# Patient Record
Sex: Female | Born: 1965 | Race: White | Hispanic: No | Marital: Single | Smoking: Current every day smoker
Health system: Southern US, Community
[De-identification: ages and names within clinical notes are randomized; demographics above are authoritative.]

## PROBLEM LIST (undated history)

## (undated) DIAGNOSIS — C6990 Malignant neoplasm of unspecified site of unspecified eye: Secondary | ICD-10-CM

## (undated) DIAGNOSIS — C801 Malignant (primary) neoplasm, unspecified: Secondary | ICD-10-CM

## (undated) DIAGNOSIS — N361 Urethral diverticulum: Secondary | ICD-10-CM

## (undated) DIAGNOSIS — IMO0002 Reserved for concepts with insufficient information to code with codable children: Secondary | ICD-10-CM

## (undated) DIAGNOSIS — F419 Anxiety disorder, unspecified: Secondary | ICD-10-CM

## (undated) DIAGNOSIS — M1990 Unspecified osteoarthritis, unspecified site: Secondary | ICD-10-CM

## (undated) DIAGNOSIS — F319 Bipolar disorder, unspecified: Secondary | ICD-10-CM

## (undated) DIAGNOSIS — G8929 Other chronic pain: Secondary | ICD-10-CM

## (undated) DIAGNOSIS — J449 Chronic obstructive pulmonary disease, unspecified: Secondary | ICD-10-CM

## (undated) DIAGNOSIS — N952 Postmenopausal atrophic vaginitis: Secondary | ICD-10-CM

## (undated) DIAGNOSIS — C539 Malignant neoplasm of cervix uteri, unspecified: Secondary | ICD-10-CM

## (undated) DIAGNOSIS — R569 Unspecified convulsions: Secondary | ICD-10-CM

## (undated) DIAGNOSIS — J439 Emphysema, unspecified: Secondary | ICD-10-CM

## (undated) HISTORY — DX: Postmenopausal atrophic vaginitis: N95.2

## (undated) HISTORY — DX: Unspecified convulsions: R56.9

## (undated) HISTORY — PX: ABDOMINAL HYSTERECTOMY: SHX81

## (undated) HISTORY — PX: TOTAL ABDOMINAL HYSTERECTOMY W/ BILATERAL SALPINGOOPHORECTOMY: SHX83

## (undated) HISTORY — DX: Malignant neoplasm of unspecified site of unspecified eye: C69.90

## (undated) HISTORY — DX: Urethral diverticulum: N36.1

## (undated) HISTORY — DX: Reserved for concepts with insufficient information to code with codable children: IMO0002

## (undated) HISTORY — DX: Bipolar disorder, unspecified: F31.9

## (undated) HISTORY — DX: Malignant neoplasm of cervix uteri, unspecified: C53.9

## (undated) HISTORY — DX: Unspecified osteoarthritis, unspecified site: M19.90

## (undated) HISTORY — DX: Malignant (primary) neoplasm, unspecified: C80.1

---

## 1997-06-27 ENCOUNTER — Other Ambulatory Visit: Admission: RE | Admit: 1997-06-27 | Discharge: 1997-06-27 | Payer: Self-pay | Admitting: Obstetrics and Gynecology

## 1997-07-19 ENCOUNTER — Other Ambulatory Visit: Admission: RE | Admit: 1997-07-19 | Discharge: 1997-07-19 | Payer: Self-pay | Admitting: Obstetrics and Gynecology

## 1997-08-04 ENCOUNTER — Emergency Department (HOSPITAL_COMMUNITY): Admission: EM | Admit: 1997-08-04 | Discharge: 1997-08-04 | Payer: Self-pay | Admitting: Emergency Medicine

## 1999-01-22 ENCOUNTER — Emergency Department (HOSPITAL_COMMUNITY): Admission: EM | Admit: 1999-01-22 | Discharge: 1999-01-22 | Payer: Self-pay | Admitting: Emergency Medicine

## 1999-01-22 ENCOUNTER — Encounter: Payer: Self-pay | Admitting: Emergency Medicine

## 1999-01-24 ENCOUNTER — Encounter: Admission: RE | Admit: 1999-01-24 | Discharge: 1999-01-24 | Payer: Self-pay | Admitting: Sports Medicine

## 1999-01-24 ENCOUNTER — Encounter: Payer: Self-pay | Admitting: Sports Medicine

## 1999-01-30 ENCOUNTER — Encounter: Payer: Self-pay | Admitting: *Deleted

## 1999-01-30 ENCOUNTER — Emergency Department (HOSPITAL_COMMUNITY): Admission: EM | Admit: 1999-01-30 | Discharge: 1999-01-30 | Payer: Self-pay | Admitting: Emergency Medicine

## 2001-02-03 ENCOUNTER — Emergency Department (HOSPITAL_COMMUNITY): Admission: EM | Admit: 2001-02-03 | Discharge: 2001-02-03 | Payer: Self-pay | Admitting: *Deleted

## 2001-05-15 ENCOUNTER — Emergency Department (HOSPITAL_COMMUNITY): Admission: EM | Admit: 2001-05-15 | Discharge: 2001-05-15 | Payer: Self-pay | Admitting: Emergency Medicine

## 2008-10-09 ENCOUNTER — Emergency Department: Payer: Self-pay | Admitting: Emergency Medicine

## 2008-10-26 ENCOUNTER — Emergency Department: Payer: Self-pay | Admitting: Emergency Medicine

## 2009-03-07 ENCOUNTER — Emergency Department: Payer: Self-pay | Admitting: Emergency Medicine

## 2009-03-22 ENCOUNTER — Emergency Department: Payer: Self-pay | Admitting: Emergency Medicine

## 2009-03-24 ENCOUNTER — Emergency Department: Payer: Self-pay | Admitting: Emergency Medicine

## 2009-06-28 ENCOUNTER — Ambulatory Visit: Payer: Self-pay

## 2009-09-12 ENCOUNTER — Emergency Department: Payer: Self-pay | Admitting: Emergency Medicine

## 2010-03-04 HISTORY — PX: VAGINAL PROLAPSE REPAIR: SHX830

## 2010-03-20 ENCOUNTER — Emergency Department (HOSPITAL_COMMUNITY)
Admission: EM | Admit: 2010-03-20 | Discharge: 2010-03-20 | Payer: Self-pay | Source: Home / Self Care | Admitting: Emergency Medicine

## 2010-05-28 ENCOUNTER — Emergency Department (HOSPITAL_COMMUNITY)
Admission: EM | Admit: 2010-05-28 | Discharge: 2010-05-28 | Discharge status: Against Medical Advice | Payer: Self-pay | Attending: Emergency Medicine | Admitting: Emergency Medicine

## 2010-05-28 ENCOUNTER — Emergency Department (HOSPITAL_COMMUNITY)
Admission: EM | Admit: 2010-05-28 | Discharge: 2010-05-28 | Disposition: A | Discharge status: Home / Self Care | Payer: Self-pay | Attending: Emergency Medicine | Admitting: Emergency Medicine

## 2010-05-28 ENCOUNTER — Emergency Department (HOSPITAL_COMMUNITY): Payer: Self-pay

## 2010-05-28 DIAGNOSIS — F17200 Nicotine dependence, unspecified, uncomplicated: Secondary | ICD-10-CM | POA: Insufficient documentation

## 2010-05-28 DIAGNOSIS — R5383 Other fatigue: Secondary | ICD-10-CM | POA: Insufficient documentation

## 2010-05-28 DIAGNOSIS — R0602 Shortness of breath: Secondary | ICD-10-CM | POA: Insufficient documentation

## 2010-05-28 DIAGNOSIS — R5381 Other malaise: Secondary | ICD-10-CM | POA: Insufficient documentation

## 2010-05-28 DIAGNOSIS — R21 Rash and other nonspecific skin eruption: Secondary | ICD-10-CM | POA: Insufficient documentation

## 2010-06-11 ENCOUNTER — Emergency Department (HOSPITAL_COMMUNITY)
Admission: EM | Admit: 2010-06-11 | Discharge: 2010-06-11 | Disposition: A | Discharge status: Home / Self Care | Payer: Self-pay | Attending: Emergency Medicine | Admitting: Emergency Medicine

## 2010-06-11 ENCOUNTER — Emergency Department (HOSPITAL_COMMUNITY): Payer: Self-pay

## 2010-06-11 DIAGNOSIS — R112 Nausea with vomiting, unspecified: Secondary | ICD-10-CM | POA: Insufficient documentation

## 2010-06-11 DIAGNOSIS — N8111 Cystocele, midline: Secondary | ICD-10-CM | POA: Insufficient documentation

## 2010-06-11 DIAGNOSIS — R109 Unspecified abdominal pain: Secondary | ICD-10-CM | POA: Insufficient documentation

## 2010-06-11 DIAGNOSIS — R32 Unspecified urinary incontinence: Secondary | ICD-10-CM | POA: Insufficient documentation

## 2010-06-11 DIAGNOSIS — R079 Chest pain, unspecified: Secondary | ICD-10-CM | POA: Insufficient documentation

## 2010-06-11 LAB — POCT I-STAT, CHEM 8
BUN: 9 mg/dL (ref 6–23)
Chloride: 108 mEq/L (ref 96–112)
Potassium: 3.8 mEq/L (ref 3.5–5.1)
Sodium: 141 mEq/L (ref 135–145)

## 2010-06-11 LAB — URINE MICROSCOPIC-ADD ON

## 2010-06-11 LAB — URINALYSIS, ROUTINE W REFLEX MICROSCOPIC
Glucose, UA: NEGATIVE mg/dL
Ketones, ur: NEGATIVE mg/dL
Leukocytes, UA: NEGATIVE
pH: 6 (ref 5.0–8.0)

## 2010-06-11 LAB — DIFFERENTIAL
Basophils Relative: 0 % (ref 0–1)
Lymphocytes Relative: 34 % (ref 12–46)
Lymphs Abs: 2.5 10*3/uL (ref 0.7–4.0)
Monocytes Relative: 5 % (ref 3–12)
Neutro Abs: 4.4 10*3/uL (ref 1.7–7.7)
Neutrophils Relative %: 60 % (ref 43–77)

## 2010-06-11 LAB — CBC
Hemoglobin: 14.9 g/dL (ref 12.0–15.0)
MCH: 31.8 pg (ref 26.0–34.0)
MCV: 93 fL (ref 78.0–100.0)
RBC: 4.69 MIL/uL (ref 3.87–5.11)

## 2010-06-27 ENCOUNTER — Emergency Department (HOSPITAL_COMMUNITY)
Admission: EM | Admit: 2010-06-27 | Discharge: 2010-06-27 | Disposition: A | Discharge status: Home / Self Care | Payer: Self-pay | Attending: Emergency Medicine | Admitting: Emergency Medicine

## 2010-06-27 DIAGNOSIS — Y929 Unspecified place or not applicable: Secondary | ICD-10-CM | POA: Insufficient documentation

## 2010-06-27 DIAGNOSIS — IMO0002 Reserved for concepts with insufficient information to code with codable children: Secondary | ICD-10-CM | POA: Insufficient documentation

## 2010-06-27 DIAGNOSIS — J45909 Unspecified asthma, uncomplicated: Secondary | ICD-10-CM | POA: Insufficient documentation

## 2010-06-27 DIAGNOSIS — Z8541 Personal history of malignant neoplasm of cervix uteri: Secondary | ICD-10-CM | POA: Insufficient documentation

## 2010-06-27 DIAGNOSIS — R109 Unspecified abdominal pain: Secondary | ICD-10-CM | POA: Insufficient documentation

## 2010-06-27 DIAGNOSIS — I891 Lymphangitis: Secondary | ICD-10-CM | POA: Insufficient documentation

## 2010-06-27 DIAGNOSIS — W57XXXA Bitten or stung by nonvenomous insect and other nonvenomous arthropods, initial encounter: Secondary | ICD-10-CM | POA: Insufficient documentation

## 2010-06-27 DIAGNOSIS — S30860A Insect bite (nonvenomous) of lower back and pelvis, initial encounter: Secondary | ICD-10-CM | POA: Insufficient documentation

## 2010-06-28 ENCOUNTER — Telehealth: Payer: Self-pay | Admitting: Internal Medicine

## 2010-06-28 NOTE — Telephone Encounter (Signed)
Patient's husband states her bladder has fallen. Told him patient would need to see a urologist for this.

## 2010-07-03 ENCOUNTER — Emergency Department (HOSPITAL_COMMUNITY)
Admission: EM | Admit: 2010-07-03 | Discharge: 2010-07-03 | Disposition: A | Discharge status: Home / Self Care | Payer: Self-pay | Attending: Emergency Medicine | Admitting: Emergency Medicine

## 2010-07-03 DIAGNOSIS — R300 Dysuria: Secondary | ICD-10-CM | POA: Insufficient documentation

## 2010-07-03 DIAGNOSIS — G40909 Epilepsy, unspecified, not intractable, without status epilepticus: Secondary | ICD-10-CM | POA: Insufficient documentation

## 2010-07-03 DIAGNOSIS — Z8541 Personal history of malignant neoplasm of cervix uteri: Secondary | ICD-10-CM | POA: Insufficient documentation

## 2010-07-03 DIAGNOSIS — R21 Rash and other nonspecific skin eruption: Secondary | ICD-10-CM | POA: Insufficient documentation

## 2010-07-03 DIAGNOSIS — Z79899 Other long term (current) drug therapy: Secondary | ICD-10-CM | POA: Insufficient documentation

## 2010-07-03 DIAGNOSIS — J45909 Unspecified asthma, uncomplicated: Secondary | ICD-10-CM | POA: Insufficient documentation

## 2010-07-03 LAB — URINALYSIS, ROUTINE W REFLEX MICROSCOPIC
Bilirubin Urine: NEGATIVE
Ketones, ur: NEGATIVE mg/dL
Protein, ur: NEGATIVE mg/dL
Urobilinogen, UA: 0.2 mg/dL (ref 0.0–1.0)

## 2010-07-05 ENCOUNTER — Inpatient Hospital Stay (INDEPENDENT_AMBULATORY_CARE_PROVIDER_SITE_OTHER)
Admission: RE | Admit: 2010-07-05 | Discharge: 2010-07-05 | Disposition: A | Discharge status: Home / Self Care | Payer: Self-pay | Source: Ambulatory Visit | Attending: Family Medicine | Admitting: Family Medicine

## 2010-07-05 DIAGNOSIS — J45909 Unspecified asthma, uncomplicated: Secondary | ICD-10-CM

## 2010-07-25 ENCOUNTER — Inpatient Hospital Stay (HOSPITAL_COMMUNITY)
Admission: AD | Admit: 2010-07-25 | Discharge: 2010-07-25 | Disposition: A | Discharge status: Home / Self Care | Payer: Self-pay | Source: Ambulatory Visit | Attending: Obstetrics and Gynecology | Admitting: Obstetrics and Gynecology

## 2010-07-25 ENCOUNTER — Inpatient Hospital Stay (HOSPITAL_COMMUNITY): Payer: Self-pay

## 2010-07-25 DIAGNOSIS — R3989 Other symptoms and signs involving the genitourinary system: Secondary | ICD-10-CM

## 2010-07-25 DIAGNOSIS — G43909 Migraine, unspecified, not intractable, without status migrainosus: Secondary | ICD-10-CM

## 2010-07-25 LAB — URINE MICROSCOPIC-ADD ON

## 2010-07-25 LAB — URINALYSIS, ROUTINE W REFLEX MICROSCOPIC
Glucose, UA: NEGATIVE mg/dL
Leukocytes, UA: NEGATIVE
Specific Gravity, Urine: >1.03 — ABNORMAL HIGH (ref 1.005–1.030)
pH: 5.5 (ref 5.0–8.0)

## 2010-07-25 LAB — WET PREP, GENITAL: Trich, Wet Prep: NONE SEEN

## 2010-07-25 LAB — CBC
MCHC: 35.2 g/dL (ref 30.0–36.0)
Platelets: 182 10*3/uL (ref 150–400)
RDW: 12.4 % (ref 11.5–15.5)
WBC: 9.2 10*3/uL (ref 4.0–10.5)

## 2010-07-25 LAB — COMPREHENSIVE METABOLIC PANEL
ALT: 10 U/L (ref 0–35)
Albumin: 4.2 g/dL (ref 3.5–5.2)
Alkaline Phosphatase: 104 U/L (ref 39–117)
BUN: 14 mg/dL (ref 6–23)
Calcium: 9.5 mg/dL (ref 8.4–10.5)
Potassium: 4.1 mEq/L (ref 3.5–5.1)
Sodium: 138 mEq/L (ref 135–145)
Total Protein: 7.4 g/dL (ref 6.0–8.3)

## 2010-07-26 LAB — GC/CHLAMYDIA PROBE AMP, GENITAL
Chlamydia, DNA Probe: NEGATIVE
GC Probe Amp, Genital: NEGATIVE

## 2010-08-02 ENCOUNTER — Other Ambulatory Visit: Payer: Self-pay | Admitting: Obstetrics and Gynecology

## 2010-08-02 ENCOUNTER — Encounter (INDEPENDENT_AMBULATORY_CARE_PROVIDER_SITE_OTHER): Payer: Self-pay | Admitting: Obstetrics and Gynecology

## 2010-08-02 DIAGNOSIS — Z124 Encounter for screening for malignant neoplasm of cervix: Secondary | ICD-10-CM

## 2010-08-02 DIAGNOSIS — Z01419 Encounter for gynecological examination (general) (routine) without abnormal findings: Secondary | ICD-10-CM

## 2010-08-02 DIAGNOSIS — R32 Unspecified urinary incontinence: Secondary | ICD-10-CM

## 2010-08-03 NOTE — Group Therapy Note (Signed)
Shelby Wang, Shelby Wang               ACCOUNT NO.:  [id number]  MEDICAL RECORD NO.:  [id number]           PATIENT TYPE:  A  LOCATION:  [facility]                   FACILITY:  [facility]  PHYSICIAN:  Argentina Donovan, MD        DATE OF BIRTH:  Sep 03, 1965  DATE OF SERVICE:  08/02/2010                                 CLINIC NOTE  HISTORY OF PRESENT ILLNESS:  The patient is a 45 year old Caucasian female gravida 3, para 2-0-1-2 who had a total abdominal hysterectomy bilateral salpingo-oophorectomy many years ago for which she states was cervical cancer.  This was done at [facility].  She was on estrogen until about 7 years ago when she said she could not afford and it came off, but 3 months ago, she started taking care of her father who was bedridden with lung cancer.  He would like the garden, so she was helping him garden, started lifting heavy bags and she said at that time she started having upper abdominal pain.  She lost her appetite, she is losing urine.  She is incontinent all the time as well as having urinary frequency.  She is treated for bladder infection in the emergency room last week and she had CMET done which was generally normal.  PHYSICAL EXAMINATION:  NECK:  On examination, her thyroid was symmetrical.  No dominant masses. BREASTS:  The breasts were symmetrical with no masses.  No nipple discharge.  No supraclavicular, axillary nodes. ABDOMEN:  Soft, flat, and nontender.  No masses.  No organomegaly. PELVIC:  The external genitalia is normal.  BUS was quite atrophic with a very tender thickening underneath the urethra given the impression of a urethral diverticulum.  She does not have a significant cystocele. When she Valsalvas she has a very small descensus of the vaginal vault as well as the anterior vaginal wall. RECTAL:  There is no real rectocele noted.  The patient has also been complaining of anorexia, chronic diarrhea for 2 months.  She [facility], and I think we are having to have her see a urologist for urodynamics as well as evaluation of the urethral diverticulum.  A Pap smear was taken from the apex of the vagina because of the history of cervical cancer, although I seen no significant problem there.  I am going to give her an appointment to come back within about a month for followup to make sure she is being seen and taken care, other than that her weight is fine 119 pounds 115/86 blood pressure and a height of 5 feet tall, with a regular pulse of 76 per minute.  I am also placing her on estradiol 1 mg a day and in view of the fact she may end up needing some urological procedure done.  IMPRESSION:  Urethral diverticulum, atrophic vaginitis, upper abdominal pain with diarrhea.          ______________________________ Argentina Donovan, MD    PR/MEDQ  D:  08/02/2010  T:  08/03/2010  Job:  [id number]

## 2010-08-16 ENCOUNTER — Ambulatory Visit: Payer: Self-pay | Admitting: Obstetrics and Gynecology

## 2010-08-16 DIAGNOSIS — N361 Urethral diverticulum: Secondary | ICD-10-CM

## 2010-08-17 NOTE — Group Therapy Note (Signed)
Shelby Wang, Shelby Wang               ACCOUNT NO.:  [id number]  MEDICAL RECORD NO.:  [id number]           PATIENT TYPE:  A  [facility]ATION[facility]                   FACILITY:  [facility]  PHYSICIAN:  Argentina Donovan, MD        DATE OF BIRTH:  06-11-1965  DATE OF SERVICE:  08/16/2010                                 CLINIC NOTE  This is a followup visit on a patient who I saw in the beginning of the month with urethral diverticulum, atrophic vaginitis, upper abdominal pain, and diarrhea, but her main problem is an infected diverticulum. She was referred to a urologist in [facility] who would not treat her because she did not have any insurance, so we are trying get her over to [facility] somebody [place] to see her because she definitely needs a urological consult and surgery, and none of our doctors are qualified to do this kind of surgery.  IMPRESSION:  Urethral diverticulum.          ______________________________ Argentina Donovan, MD    PR/MEDQ  D:  08/16/2010  T:  08/17/2010  Job:  [id number]

## 2010-08-24 ENCOUNTER — Ambulatory Visit: Payer: Self-pay | Admitting: Family Medicine

## 2010-08-27 ENCOUNTER — Other Ambulatory Visit (HOSPITAL_COMMUNITY): Payer: Self-pay | Admitting: Urology

## 2010-08-27 ENCOUNTER — Ambulatory Visit (HOSPITAL_COMMUNITY)
Admission: RE | Admit: 2010-08-27 | Discharge: 2010-08-27 | Disposition: A | Discharge status: Home / Self Care | Payer: Self-pay | Source: Ambulatory Visit | Attending: Urology | Admitting: Urology

## 2010-08-27 DIAGNOSIS — R112 Nausea with vomiting, unspecified: Secondary | ICD-10-CM | POA: Insufficient documentation

## 2010-08-27 DIAGNOSIS — R102 Pelvic and perineal pain: Secondary | ICD-10-CM

## 2010-08-27 DIAGNOSIS — K7689 Other specified diseases of liver: Secondary | ICD-10-CM | POA: Insufficient documentation

## 2010-08-27 DIAGNOSIS — R319 Hematuria, unspecified: Secondary | ICD-10-CM | POA: Insufficient documentation

## 2010-08-27 DIAGNOSIS — N949 Unspecified condition associated with female genital organs and menstrual cycle: Secondary | ICD-10-CM | POA: Insufficient documentation

## 2010-08-27 DIAGNOSIS — R197 Diarrhea, unspecified: Secondary | ICD-10-CM | POA: Insufficient documentation

## 2010-08-27 MED ORDER — IOHEXOL 300 MG/ML  SOLN
100.0000 mL | Freq: Once | INTRAMUSCULAR | Status: AC | PRN
  Administered 2010-08-27: 100 mL via INTRAVENOUS

## 2010-08-30 ENCOUNTER — Inpatient Hospital Stay (HOSPITAL_COMMUNITY)
Admission: AD | Admit: 2010-08-30 | Discharge: 2010-08-30 | Disposition: A | Discharge status: Home / Self Care | Payer: Self-pay | Source: Ambulatory Visit | Attending: Family Medicine | Admitting: Family Medicine

## 2010-08-30 ENCOUNTER — Ambulatory Visit: Payer: Self-pay | Admitting: Obstetrics and Gynecology

## 2010-08-30 DIAGNOSIS — N342 Other urethritis: Secondary | ICD-10-CM

## 2010-08-30 DIAGNOSIS — R319 Hematuria, unspecified: Secondary | ICD-10-CM | POA: Insufficient documentation

## 2010-08-30 LAB — URINALYSIS, ROUTINE W REFLEX MICROSCOPIC
Bilirubin Urine: NEGATIVE
Nitrite: NEGATIVE
Protein, ur: NEGATIVE mg/dL
Specific Gravity, Urine: 1.01 (ref 1.005–1.030)
Urobilinogen, UA: 0.2 mg/dL (ref 0.0–1.0)

## 2010-08-30 LAB — URINE MICROSCOPIC-ADD ON

## 2010-08-30 LAB — WET PREP, GENITAL
Clue Cells Wet Prep HPF POC: NONE SEEN
Trich, Wet Prep: NONE SEEN

## 2010-09-06 ENCOUNTER — Ambulatory Visit: Payer: Self-pay | Admitting: Family Medicine

## 2010-09-07 ENCOUNTER — Ambulatory Visit: Payer: Self-pay | Admitting: Obstetrics and Gynecology

## 2010-09-07 DIAGNOSIS — N361 Urethral diverticulum: Secondary | ICD-10-CM

## 2010-09-08 NOTE — Group Therapy Note (Signed)
Shelby Wang, Shelby Wang               ACCOUNT NO.:  [id number]  MEDICAL RECORD NO.:  [id number]           PATIENT TYPE:  A  [facility]                   [facility]ACILITY:  [facility]  PHYSICIAN:  Argentina Donovan, MD        DATE OF BIRTH:  05-Nov-1965  DATE OF SERVICE:  09/07/2010                                 CLINIC NOTE  The patient is a 45 year old Caucasian female gravida 3, para 2-0-1-2 who has been bothered by a swelling underneath the urethra.  I thought she had an urethral diverticulum, so we referred her to a urologist first when she went to [facility] who would not to see her because she was self-pay and she came back to [place].  We gave her pain medication, told to take sitz baths, and sent her to a local urologist.  When he first saw her, he said it was a diverticulum that had ruptured and put her on antibiotics and pain medication, had her come back.  At this time, it had recurred.  At this time, he thought it was not a diverticulum something else.  He had called in a colleague to look and agreed with him that probably was not a diverticulum.  When she called me about this, I had to go into the maternity admissions office, but there once again she was put on antibiotics and pain medicine and [place] was done, so when she called today for pain, I had her come in so I could reevaluate her.  My feeling is still that this is a urethral diverticulum.  I do not think it is a Skene gland abscess.  I sounded the Skene glands with no pus production.  We will get this patient treated so as an alternative I thought I try and drain this from below. I injected with 6 mL of 1% Xylocaine so that I could massage the area, but I saw no pus production then I tried to open it with sharp dissection and got no return.  I am going to place her on pain medication.  We will come back and see what the surgeon thinks should be done in the operating room.  I am not sure that any of our people are really trained  to handle this.  However, we have almost no alternative as urologist seem who have left her out in the cold.  My preference would amend to put a Foley catheter in and then open up underneath the catheter and let this drain.  I am not going to give her any antibiotic because of the pus production and can rupture through and rather have that happen; I think at this point I am going to put her back on pain medication, have her take sitz baths regularly with Epsom salts, come back and see one of our surgeons next week.  My impression is suburethral abscess, probably a urethral diverticulum without Skene gland abscess.          ______________________________ Argentina Donovan, MD    PR/MEDQ  D:  09/07/2010  T:  09/08/2010  Job:  [id number]

## 2010-09-12 ENCOUNTER — Ambulatory Visit: Payer: Self-pay | Admitting: Obstetrics & Gynecology

## 2010-09-17 ENCOUNTER — Ambulatory Visit: Payer: Self-pay | Admitting: Family Medicine

## 2010-09-19 ENCOUNTER — Telehealth: Payer: Self-pay | Admitting: *Deleted

## 2010-09-19 NOTE — Telephone Encounter (Signed)
Pt called to request a refill on her Percocet from [name]. I spoke with [name] regarding this and he agreed to give her a refill on the Percocet. A written rx was given to the patient for Percocet 5mg  #40 and no refills. Pt to pickup rx at front desk.

## 2010-09-25 ENCOUNTER — Encounter: Payer: Self-pay | Admitting: Family Medicine

## 2010-09-25 ENCOUNTER — Ambulatory Visit (INDEPENDENT_AMBULATORY_CARE_PROVIDER_SITE_OTHER): Payer: Self-pay | Admitting: Family Medicine

## 2010-09-25 DIAGNOSIS — R3989 Other symptoms and signs involving the genitourinary system: Secondary | ICD-10-CM

## 2010-09-25 DIAGNOSIS — R112 Nausea with vomiting, unspecified: Secondary | ICD-10-CM

## 2010-09-25 DIAGNOSIS — R634 Abnormal weight loss: Secondary | ICD-10-CM

## 2010-09-25 DIAGNOSIS — Z8541 Personal history of malignant neoplasm of cervix uteri: Secondary | ICD-10-CM

## 2010-09-25 LAB — POCT URINALYSIS DIPSTICK
Glucose, UA: NEGATIVE
Leukocytes, UA: NEGATIVE
Nitrite, UA: NEGATIVE
Protein, UA: NEGATIVE
Urobilinogen, UA: 0.2

## 2010-09-25 LAB — POCT WET PREP (WET MOUNT)
Clue Cells Wet Prep HPF POC: NEGATIVE
Yeast Wet Prep HPF POC: NEGATIVE

## 2010-09-25 LAB — COMPREHENSIVE METABOLIC PANEL
AST: 12 U/L (ref 0–37)
Alkaline Phosphatase: 70 U/L (ref 39–117)
BUN: 9 mg/dL (ref 6–23)
Creat: 0.7 mg/dL (ref 0.50–1.10)
Glucose, Bld: 75 mg/dL (ref 70–99)
Total Bilirubin: 0.4 mg/dL (ref 0.3–1.2)

## 2010-09-25 LAB — CBC
HCT: 43.9 % (ref 36.0–46.0)
Hemoglobin: 14.7 g/dL (ref 12.0–15.0)
MCH: 31.4 pg (ref 26.0–34.0)
MCHC: 33.5 g/dL (ref 30.0–36.0)
RBC: 4.68 MIL/uL (ref 3.87–5.11)

## 2010-09-25 LAB — POCT UA - MICROSCOPIC ONLY

## 2010-09-25 LAB — POCT GLYCOSYLATED HEMOGLOBIN (HGB A1C): Hemoglobin A1C: 5.5

## 2010-09-25 MED ORDER — OXYCODONE-ACETAMINOPHEN 5-325 MG PO TABS: 1.0000 | ORAL_TABLET | ORAL | Status: AC | PRN

## 2010-09-25 MED ORDER — PROMETHAZINE HCL 12.5 MG PO TABS: 12.5000 mg | ORAL_TABLET | Freq: Four times a day (QID) | ORAL | Status: DC | PRN

## 2010-09-25 NOTE — Patient Instructions (Addendum)
Nice to meet you. I will call if your labs are abnormal. You may try AZO for urinary pain over the counter. Please make appointment in next month to discuss other health issues if desired.

## 2010-09-26 ENCOUNTER — Encounter: Payer: Self-pay | Admitting: Family Medicine

## 2010-09-26 ENCOUNTER — Telehealth: Payer: Self-pay | Admitting: Family Medicine

## 2010-09-26 DIAGNOSIS — Z8541 Personal history of malignant neoplasm of cervix uteri: Secondary | ICD-10-CM | POA: Insufficient documentation

## 2010-09-26 NOTE — Telephone Encounter (Signed)
Called to discuss normal labs. Urine culture pending. Pain is stable. Warned of red flags for MD evaluation: fever, n/v, worsening of pain, bleeding.

## 2010-09-26 NOTE — Assessment & Plan Note (Signed)
Likely multifactoral process with atrophic vaginitis and atypical anatomy post-TAH with question of urethral diverticulum. WIll check UA and urine ctx to rule out cystitis and wet prep, GC/Chl for other infectious sources. Will check WBC to eval for systemic infectious process. Oxycodone for pain due to severity and decline in functional ability. Patient to f/u with OB/GYN and urology referral, appt in mid-august.

## 2010-09-26 NOTE — Assessment & Plan Note (Signed)
Negative pap of vaginal vault at GYN clinic in 07/2010. Negative CT in 08/2010.

## 2010-09-26 NOTE — Telephone Encounter (Signed)
Dr was supposed to call her with results - has not heard yet.

## 2010-09-26 NOTE — Assessment & Plan Note (Signed)
Most likely result of decreased appetite secondary to pain. Recent CT abd/pelvis negative for recurrent malignancy. Will check labs CMET, CBC, A1C to evaluate for nutrition status and glucose metabolism. Encourage patient to eat small meals as tolerated, phenergan prn for nausea.

## 2010-09-26 NOTE — Progress Notes (Signed)
  Subjective:    Patient ID: Shelby Wang, female    DOB: 25-Jul-1965, 45 y.o.   MRN: [id number]  HPI New patient to establish care, no current PCP.   1. Pelvic pain. Has been intermittent over past several months. Evaluated in [facility] and follow up by GYN- last saw Dr. Okey Wang few weeks ago for evaluation of pain who found atrophic vaginitis and likely urethral diverticulum. Was also evaluated by a urologist who did not agree with urethral involvement. Dr. Okey Wang performed I&D of "diverticulum," but no purulence was uncovered. Was not believed to be involved with Skene glands or infectious source. Plans to follow up with Dr. Okey Wang and has referral to different Urologist at [facility] in mid-August. Notably has hx of TAH_BSO for cervical cancer >10 yrs ago.  Patient is unfortunately having severe perineal pain again this week. Also having dysuria and frequency. Pain is positional and has to sit with legs spread to avoid pain.  Mild white discharge. Denies abnormal bleeding or hematuria.   2. Weight loss. Totals 8 lb in past month per patient. Thinks her severe pelvic pain has diminished her appetite and she is nauseas at times. Is keeping down fluids and most food. Denies abdominal pain. Had some loose stools in recent past.    Review of Systems See HPI. Denies fevers, abdominal pain, n/v, SOB. Endorses left inguinal LAD, weight loss of 8 lb this month.     Objective:   Physical Exam  Vitals reviewed. Constitutional: She is oriented to person, place, and time. She appears well-developed and well-nourished.       Appears uncomfortable.  HENT:  Head: Normocephalic and atraumatic.  Eyes: EOM are normal. Pupils are equal, round, and reactive to light.  Cardiovascular: Normal rate and regular rhythm.   No murmur heard. Pulmonary/Chest: Effort normal. No respiratory distress. She has no wheezes.  Abdominal: Soft. Bowel sounds are normal. She exhibits no distension. There is no tenderness. There is no  guarding.  Genitourinary: No vaginal discharge found.       Extremely tender to touch, exam limited due to pain and patient intolerance. Refuses speculum exam. Atrophy in labia minora. No bleeding,  lesions or abcesses palpated. Urethra appears WNL. No discharge noted. Single left inguinal node <0.5cm, nontender.  Musculoskeletal: She exhibits no edema and no tenderness.  Neurological: She is alert and oriented to person, place, and time. No cranial nerve deficit.          Assessment & Plan:

## 2010-09-27 LAB — URINE CULTURE

## 2010-09-28 ENCOUNTER — Telehealth: Payer: Self-pay | Admitting: Family Medicine

## 2010-09-28 NOTE — Telephone Encounter (Signed)
Spoke with  patient and she states her pain is worse than when at office visit earlier this week. States she hurts in lymph node area , groin area.  Paged Dr. Cristal Ford and she advises that if pain is this severe she needs to be re evaluated . Advised daughter to take to either ED or Urgent Care.

## 2010-09-28 NOTE — Telephone Encounter (Signed)
Ms. Tadros daughter has called and said that the pain medication Percocet doesn't seem to be helping.  Ms. Geck was directed to take 1 every 4 hours, but it wasn't helping so the daughter told her to try taking 2 and that lasted 30 minutes.  They would like to know if there is something stronger she can take and would like a prescription for that.

## 2010-10-01 ENCOUNTER — Other Ambulatory Visit: Payer: Self-pay

## 2010-10-01 MED ORDER — FLUCONAZOLE 150 MG PO TABS: 150.0000 mg | ORAL_TABLET | Freq: Once | ORAL | Status: AC

## 2010-10-01 MED ORDER — DICLOFENAC SODIUM 75 MG PO TBEC: 75.0000 mg | DELAYED_RELEASE_TABLET | Freq: Two times a day (BID) | ORAL | Status: DC

## 2010-10-01 MED ORDER — HYDROCODONE-ACETAMINOPHEN 5-500 MG PO TABS: 1.0000 | ORAL_TABLET | Freq: Four times a day (QID) | ORAL | Status: DC | PRN

## 2010-10-01 NOTE — Telephone Encounter (Signed)
Pt returned call and I asked pt when was her appt for the urologist she stated Aug 16th.  I informed pt of new changes to her pain med regimen as stated in above note.  And also explained reason for not prescribing any antibiotics as well as stated in the above note.  Pt stated that she has white clumpy stuff coming from vagina and I informed pt that she had a yeast infection, monistat otc would be good.  But we will give a Rx for the yeast infection as well.  Pt stated that understands and will be here later today to pick up Rx.  Also informed pt that as of Aug 16th the day of urologist appt, that she will need to direct her futher s/s of this issue to her urologist.  Pt stated understanding.

## 2010-10-01 NOTE — Telephone Encounter (Signed)
Pt called and stated that she needed another Rx for antibiotics for the inflammation in lymph nodes in her vaginal area and more pain med.  Per Dr. Macon Large will not be prescribing med for antibiotics due to the fact that if the antibiotics given haven't penetrated yet, then they aren't.  And  Rx has been changed from percocet to Vicodin 5/500 1-2 tabs q 6 hrs along with Diclofenac DR 75mg  bid that way she will not need as much as the Vicodin.

## 2010-10-08 ENCOUNTER — Telehealth: Payer: Self-pay | Admitting: *Deleted

## 2010-10-08 NOTE — Telephone Encounter (Signed)
[facility] to see when Vicodin was filled last. Left message for pharmacist to call back. Have not called pt at this time.

## 2010-10-09 ENCOUNTER — Other Ambulatory Visit: Payer: Self-pay

## 2010-10-09 ENCOUNTER — Telehealth: Payer: Self-pay | Admitting: Family Medicine

## 2010-10-09 NOTE — Telephone Encounter (Signed)
Yes she needs to come back if she needs more pain medicine because I don't have a documented cause for the pain and she needs to be re-evaluated. Thanks

## 2010-10-09 NOTE — Telephone Encounter (Signed)
Spoke with patient and she has used AZO with no help. Wanted to know if you would call something in for her. I informed her that she may have to come into the office. Laureen Ochs, [name]

## 2010-10-09 NOTE — Telephone Encounter (Signed)
Is needing a Rx for pain medicine for when going to the bathroom.  Has blood in urine.  Has an appt with a Urologist on 8/16, but needs something until then.

## 2010-10-09 NOTE — Telephone Encounter (Signed)
Stoney Bang LPN spoke w/pharmacist @ [facility] re: pt recent pain med scripts. Her recent hx as follows:               10/01/10    Vicodin 5/500   # 30   Dr. Macon Large              10/01/10    Diclofenac 75 mg # 30 Dr. Macon Large              09/25/10    Percocet 5 mg # 30  Dr. Cristal Ford              09/20/10    Percocet 5 mg # 40  [name]              09/13/10    Percocet 5 mg # 15  Dr. Clinton Gallant              09/07/10      Percocet 5 mg # 40  [name]              09/04/10      Percocet 10 mg # 30  Dr. Signe Colt              08/31/10    Percocet 5 mg  # 20   Deirdre Poe CNM   Upon review of telephone encounter 09/28/10  w/Patricia Katrinka Blazing RN, pt's daughter was instructed to take Shon to the ED for re-evaluation of pain. Pt has been getting pain med Rx from multiple providers and this was not known by our office until today. When Dr. Macon Large ordered Rx's on 10/01/10, it was her intention that the meds would be sufficient until pt's appt. @ [facility] urology clinic in mid August. I called pt and spoke with a man who stated that he was not @ home but would return in 30 min. And I could call back to speak with Maegen @ that time.

## 2010-10-09 NOTE — Telephone Encounter (Signed)
Spoke w/pt.  I stated that she has received quite a bit of narcotic med over the past month from several different providers and that our physicians could not give any additional refills without a new evaluation. I said that it was Dr. Mont Dutton intention that her recent refill would be adequate until her appt @ [facility]. Pt stated that she could not take the Diclofenac because it upsets her stomach- even when taken with food.  I advised pt to go to [facility] for evaluation if she needs additional meds prior to [facility] clinic appt next week. I also stated that once she is seen @ that clinic , they should take over her pain management for this problem. Pt. voiced understanding.

## 2010-10-09 NOTE — Telephone Encounter (Signed)
Informed pt that Dr. Cristal Ford would like for her to come in to be evaluated. Pt agreed.Shelby Wang, [name]

## 2010-10-10 ENCOUNTER — Encounter: Payer: Self-pay | Admitting: Family

## 2010-10-11 ENCOUNTER — Encounter: Payer: Self-pay | Admitting: Family Medicine

## 2010-10-11 ENCOUNTER — Ambulatory Visit (INDEPENDENT_AMBULATORY_CARE_PROVIDER_SITE_OTHER): Payer: Self-pay | Admitting: Family Medicine

## 2010-10-11 VITALS — BP 119/76 | HR 72 | Temp 98.2°F | Wt 108.0 lb

## 2010-10-11 DIAGNOSIS — R319 Hematuria, unspecified: Secondary | ICD-10-CM

## 2010-10-11 DIAGNOSIS — R3989 Other symptoms and signs involving the genitourinary system: Secondary | ICD-10-CM

## 2010-10-11 DIAGNOSIS — R1032 Left lower quadrant pain: Secondary | ICD-10-CM

## 2010-10-11 LAB — POCT URINALYSIS DIPSTICK
Glucose, UA: NEGATIVE
Nitrite, UA: NEGATIVE
Urobilinogen, UA: 0.2
pH, UA: 5.5

## 2010-10-11 MED ORDER — GABAPENTIN 300 MG PO CAPS: 300.0000 mg | ORAL_CAPSULE | Freq: Every day | ORAL | Status: DC

## 2010-10-11 MED ORDER — OXYCODONE-ACETAMINOPHEN 5-500 MG PO CAPS: 1.0000 | ORAL_CAPSULE | ORAL | Status: AC | PRN

## 2010-10-11 MED ORDER — AMITRIPTYLINE HCL 25 MG PO TABS: 25.0000 mg | ORAL_TABLET | Freq: Every day | ORAL | Status: DC

## 2010-10-11 NOTE — Patient Instructions (Addendum)
Will try to start pain medicine for nerve pain today. Neurontin 300mg  at nighttime.  Get your xrays today. I will call you with results. Go to your urology appointment on 8/15 as scheduled. Follow up in 1-2 weeks. Important to see your psychiatrist asap. Most important to see Rudell Cobb for medical assistance.

## 2010-10-12 ENCOUNTER — Ambulatory Visit (HOSPITAL_COMMUNITY)
Admission: RE | Admit: 2010-10-12 | Discharge: 2010-10-12 | Disposition: A | Discharge status: Home / Self Care | Payer: Self-pay | Source: Ambulatory Visit | Attending: Family Medicine | Admitting: Family Medicine

## 2010-10-12 DIAGNOSIS — M25559 Pain in unspecified hip: Secondary | ICD-10-CM | POA: Insufficient documentation

## 2010-10-12 DIAGNOSIS — R1032 Left lower quadrant pain: Secondary | ICD-10-CM | POA: Insufficient documentation

## 2010-10-13 ENCOUNTER — Encounter: Payer: Self-pay | Admitting: Family Medicine

## 2010-10-13 DIAGNOSIS — R319 Hematuria, unspecified: Secondary | ICD-10-CM | POA: Insufficient documentation

## 2010-10-13 NOTE — Assessment & Plan Note (Addendum)
Persistent severe pain with unknown etiology. Records reviewed again-->2x negative CT Abd/pelvis for stones or other intra-abdominal pathology. Labs from 2 weeks ago negative except for persistent hematuria. Question relation to urethral diverticulum and pelvic pain being worked up by GYN-- urology referral made--appointment scheduled 8/15 per patient. Exam today indicates possible involvement of hip joint/low back/SI joint inflammation. Also possible psychosomatic or neuropathic pain syndrome vs drug-seeking vs ?loin pain/hematuria syndrome.   Will obtain lumbar and hip plain films to be complete and may show any radiopaque renal stones. Most important to make scheduled urology appointment. Will refill percocet #15 and start low dose neurontin to assess for neuropathic response. Advised pt to seek care with her previous psychiatrist as there may be component of mood disorder playing role.

## 2010-10-13 NOTE — Assessment & Plan Note (Signed)
Associated with chronic left inguinal pain. CT stone studies negative. Possibly related to evaluation for urethral diverticulum. Urology referral made to [facility] by [facility] for additional workup.

## 2010-10-13 NOTE — Progress Notes (Signed)
  Subjective:    Patient ID: Shelby Wang, female    DOB: 1965/05/14, 45 y.o.   MRN: [id number]  HPI  1. Inguinal/pelvic pain. This is same pain she's had for months now. Continues to be severe and anxious due to lack of diagnosis and adequate treatment. Has called office, OB multiple times requesting refills for pain medication. Pain is 9/10, located in left inguinal area, characterized as burning/aching, radiates to anterior thigh, perineum and sometimes in low back region/hip. She cannot function at home except to lay in her recliner with her legs propped up. Thinks she feels "node" in her left inguinal region. Has associated decreased appetite, nausea, and continues to lose weight even since last office visit.   Still has Urology appt at [facility] scheduled for 8/15, but couldn't make it because she ran out of pain medication early this am.   2. Psychosocial. Per patient, her husband has Stage IV lung cancer with brain involvement and she has to take care of him daily. This is quite stressful.  Has a history of unspecified bipolar disorder, hasn't seen a psychiatrist in several years and hasn't taken her meds because she can't afford. Has paperwork for Rudell Cobb that she hasn't turned in yet. Plans to do so in near future.   Review of Systems Denies fevers. Endorses some dysuria.     Objective:   Physical Exam  Vitals reviewed. Constitutional: She is oriented to person, place, and time. She appears well-developed and well-nourished. She appears distressed.       Appears in significant distress/pain. Unable to sit still, very tearful.   HENT:  Head: Normocephalic and atraumatic.       Small maculopapular scab type lesion on left lateral nose.  Eyes: EOM are normal. Pupils are equal, round, and reactive to light.  Cardiovascular: Normal rate, regular rhythm, normal heart sounds and intact distal pulses.  Exam reveals no gallop.   No murmur heard. Pulmonary/Chest: Effort normal and  breath sounds normal. No respiratory distress. She has no wheezes. She has no rales.  Abdominal: Soft. Bowel sounds are normal. She exhibits no distension and no mass. There is tenderness. There is guarding.       TTP in LLQ/inguinal region. No masses or nodes palpated.   Musculoskeletal: Normal range of motion. She exhibits no edema and no tenderness.       Pain exacerbated by Left hip ROM. Tender anteriorly over inguinal ligament. TTP over lumbar spinous processes and bilateral SI joints. Positive faber and SL testing. 5/5 bilateral LE strength.   Neurological: She is alert and oriented to person, place, and time. No cranial nerve deficit. She exhibits normal muscle tone.  Psychiatric:       Very tearful and anxious, appeared to improve throughout exam with conversation regarding social stressors/care of her husband.          Assessment & Plan:

## 2010-10-13 NOTE — Assessment & Plan Note (Signed)
Persists but left inguinal pain more prominent today. F/u with urology to evaluate anatomy in work up for possible diverticulum.

## 2010-10-18 ENCOUNTER — Other Ambulatory Visit: Payer: Self-pay | Admitting: *Deleted

## 2010-10-18 NOTE — Telephone Encounter (Signed)
Left message for pt to return our call. 

## 2010-10-18 NOTE — Telephone Encounter (Signed)
Informed pt that we can no longer give her pain meds for her urology problem. Pt agrees and states that she will call the urology office.

## 2010-10-18 NOTE — Telephone Encounter (Signed)
I do not understand what patient means when she reports that "[name] cut her urethra" as [name] does not perform any procedures. Nonetheless, if she is indeed having urethral pain, the urology team should be giving her any further prescriptions.  No further prescriptions for narcotics should be given from this clinic without having a GYN etiology.

## 2010-10-18 NOTE — Telephone Encounter (Signed)
Upon further review of her chart, I did see that [name] performed an in-clinic I&D for urethral diverticulum on the patient on 09/07/10.  Nevertheless, it has been over a month ago and she should not need narcotics for such a small procedure.  Also, after reviewing her PCP notes, she had more of inguinal pain that was more concerning than her urethral pain.  As such, I still maintain that she does not have any GYN etiology or any acute reason to need narcotic medications.  If the urologist feels that she needs narcotics, they can provide her with the appropriate medications.

## 2010-10-18 NOTE — Telephone Encounter (Signed)
Pt called and states that she saw the urologist yesterday and he saw that [name] had cut her urethra. He advised that she do soaks in peroxide and water to help heal. She is calling us because she wants more pain medicine. She states that she is having terrible pain. She recently had Percocet prescribed to her by Dr. Cristal Ford on 8/9 #15, prior to that she was given Vicodin by Dr. Macon Large on 7/30. See previous telephone note to see her recent pain meds prescribed.

## 2010-10-25 ENCOUNTER — Ambulatory Visit: Payer: Self-pay | Admitting: Family Medicine

## 2010-11-12 ENCOUNTER — Telehealth: Payer: Self-pay | Admitting: Family Medicine

## 2010-11-12 DIAGNOSIS — R3989 Other symptoms and signs involving the genitourinary system: Secondary | ICD-10-CM

## 2010-11-12 MED ORDER — GABAPENTIN 300 MG PO CAPS: 300.0000 mg | ORAL_CAPSULE | Freq: Every day | ORAL | Status: DC

## 2010-11-12 NOTE — Telephone Encounter (Signed)
Called to discuss her pain symptoms. Patient requesting refill for neurontin. Ok for refill, would like an office visit when possible.

## 2010-11-19 ENCOUNTER — Ambulatory Visit: Payer: Self-pay | Admitting: Family Medicine

## 2010-12-05 ENCOUNTER — Emergency Department (HOSPITAL_COMMUNITY)
Admission: EM | Admit: 2010-12-05 | Discharge: 2010-12-05 | Discharge status: Against Medical Advice | Payer: Self-pay | Attending: Emergency Medicine | Admitting: Emergency Medicine

## 2010-12-05 ENCOUNTER — Emergency Department (HOSPITAL_COMMUNITY): Payer: Self-pay

## 2010-12-05 ENCOUNTER — Ambulatory Visit: Payer: Self-pay | Admitting: Family Medicine

## 2010-12-05 DIAGNOSIS — R509 Fever, unspecified: Secondary | ICD-10-CM | POA: Insufficient documentation

## 2010-12-05 DIAGNOSIS — G40909 Epilepsy, unspecified, not intractable, without status epilepticus: Secondary | ICD-10-CM | POA: Insufficient documentation

## 2010-12-05 DIAGNOSIS — R05 Cough: Secondary | ICD-10-CM | POA: Insufficient documentation

## 2010-12-05 DIAGNOSIS — R197 Diarrhea, unspecified: Secondary | ICD-10-CM | POA: Insufficient documentation

## 2010-12-05 DIAGNOSIS — R112 Nausea with vomiting, unspecified: Secondary | ICD-10-CM | POA: Insufficient documentation

## 2010-12-05 DIAGNOSIS — F319 Bipolar disorder, unspecified: Secondary | ICD-10-CM | POA: Insufficient documentation

## 2010-12-05 DIAGNOSIS — R059 Cough, unspecified: Secondary | ICD-10-CM | POA: Insufficient documentation

## 2010-12-05 LAB — BASIC METABOLIC PANEL
BUN: 11 mg/dL (ref 6–23)
CO2: 26 mEq/L (ref 19–32)
Calcium: 9.3 mg/dL (ref 8.4–10.5)
GFR calc non Af Amer: >90 mL/min (ref >90)
Glucose, Bld: 83 mg/dL (ref 70–99)
Potassium: 4.3 mEq/L (ref 3.5–5.1)

## 2010-12-05 LAB — CBC
Platelets: 205 10*3/uL (ref 150–400)
RDW: 12.4 % (ref 11.5–15.5)
WBC: 8.9 10*3/uL (ref 4.0–10.5)

## 2010-12-05 LAB — DIFFERENTIAL
Basophils Absolute: 0 10*3/uL (ref 0.0–0.1)
Basophils Relative: 0 % (ref 0–1)
Eosinophils Absolute: 0.1 10*3/uL (ref 0.0–0.7)
Eosinophils Relative: 1 % (ref 0–5)
Lymphocytes Relative: 32 % (ref 12–46)

## 2010-12-05 LAB — URINALYSIS, ROUTINE W REFLEX MICROSCOPIC
Nitrite: NEGATIVE
Protein, ur: NEGATIVE mg/dL
Specific Gravity, Urine: 1.014 (ref 1.005–1.030)
Urobilinogen, UA: 0.2 mg/dL (ref 0.0–1.0)

## 2010-12-05 LAB — URINE MICROSCOPIC-ADD ON

## 2010-12-05 LAB — POCT PREGNANCY, URINE: Preg Test, Ur: NEGATIVE

## 2010-12-06 ENCOUNTER — Ambulatory Visit (INDEPENDENT_AMBULATORY_CARE_PROVIDER_SITE_OTHER): Payer: Self-pay | Admitting: Family Medicine

## 2010-12-06 ENCOUNTER — Encounter: Payer: Self-pay | Admitting: Family Medicine

## 2010-12-06 VITALS — BP 116/76 | Temp 98.1°F | Ht 60.0 in | Wt 111.3 lb

## 2010-12-06 DIAGNOSIS — J449 Chronic obstructive pulmonary disease, unspecified: Secondary | ICD-10-CM | POA: Insufficient documentation

## 2010-12-06 DIAGNOSIS — F39 Unspecified mood [affective] disorder: Secondary | ICD-10-CM

## 2010-12-06 DIAGNOSIS — J441 Chronic obstructive pulmonary disease with (acute) exacerbation: Secondary | ICD-10-CM

## 2010-12-06 MED ORDER — PREDNISONE 50 MG PO TABS: 50.0000 mg | ORAL_TABLET | Freq: Every day | ORAL | Status: AC

## 2010-12-06 NOTE — Assessment & Plan Note (Signed)
WIll treat COPD exacerbation- sounds like this may be her first diagnosis of this.  Prednisone x 5 days, continue albuterol.  Advised smoking cessation- patient not interested at this time.  CXR and WBC normal at ER last night.

## 2010-12-06 NOTE — Assessment & Plan Note (Signed)
Advised patient may increase from gabapentin 1 tab qhs to three tabs a day to split as she feels necessary.  I suspect this is having more positive effect on her mood disorder than its intended dx of chronic pain.  Again urged patient to seek psychiatric care.  Will follow-up with PCP.

## 2010-12-06 NOTE — Patient Instructions (Signed)
Start prednisone for 5 days to help calm your cough down Important to stop smoking Can increase gabapentin to 3 tablets daily. Follow-up with Dr. Cristal Ford at next available appointment

## 2010-12-06 NOTE — Progress Notes (Signed)
  Subjective:    Patient ID: Shelby Wang, female    DOB: 12/11/1965, 45 y.o.   MRN: [id number]  HPI  Patient here for work in appt for cough.  Was seen in ER last night four same issue.  Had CXR that shows chronic COPD changes and normal CBC.  She left AMA.  Patient states cough x 4 days, productive with dyspnea.  Has been using albuterol every few hours, does not need a refill.  Also reports chronic issues of abdominal pain, emesis, urethral pain, back pain.    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  She does not plan to return to see gynecology again  She has not seen psychiatry- states she is overwhelmed as her husband has stage IV brain cancer. Review of Systems I have reviewed patient's  PMH, FH, and Social history and Medications as related to this visit.  See HPI for ros     Objective:   Physical Exam GEN: Alert & Oriented, appears in pain, anxious CV:  Regular Rate & Rhythm, no murmur Respiratory:  Normal work of breathing, inspiratory and expiratory wheezes Abd:  + BS, soft, no rebound or guarding Ext: no pre-tibial edema         Assessment & Plan:

## 2010-12-07 ENCOUNTER — Telehealth: Payer: Self-pay | Admitting: Family Medicine

## 2010-12-07 NOTE — Telephone Encounter (Signed)
Thanks

## 2010-12-07 NOTE — Telephone Encounter (Signed)
There was a cancellation for Dr. Cristal Ford for this morning, attempted to put the patient in that slot, but she would not be able to make it due to her husbands end stage Cancer, not being able to leave him alone.  The next available with Dr. Cristal Ford is next Wednesday, she will plan on see her then.

## 2010-12-07 NOTE — Telephone Encounter (Signed)
Patient was seen yesterday for COPD evaluation- she was having trouble breathing.  Please have her follow-up with her PCP if she has other concerns

## 2010-12-07 NOTE — Telephone Encounter (Signed)
Is calling because she is bleeding from her rectum and she was here yesterday, and mentioned this then.  She wants to know what this is coming from.

## 2010-12-12 ENCOUNTER — Ambulatory Visit: Payer: Self-pay | Admitting: Family Medicine

## 2010-12-17 ENCOUNTER — Ambulatory Visit: Payer: Self-pay | Admitting: Family Medicine

## 2011-01-08 ENCOUNTER — Telehealth: Payer: Self-pay | Admitting: Obstetrics & Gynecology

## 2011-01-08 NOTE — Telephone Encounter (Signed)
Received call from a provider at [facility] ED that patient has presented there today reporting significant pelvic pain.  He reports they have seen here a few days ago, and did a CT urethrogram which was unremarkable, no other etiology of her pain was found.  Vitals are stable, no other acute findings on exam or laboratory evaluation.  Of note, patient had extensive evaluation over the past few months here at [facility], the only remarkable finding was of a possible urethral diverticulum that was found on exam.  I &D was attempted in office, this this did not help her symptoms.  Patient is also followed at the [facility] and received Gabapentin and narcotics for her pain. This information was shared with the provider; it was recommended that the patient follow up with [facility] as previously emphasized.

## 2011-01-09 ENCOUNTER — Emergency Department (HOSPITAL_COMMUNITY)
Admission: EM | Admit: 2011-01-09 | Discharge: 2011-01-09 | Disposition: A | Discharge status: Home / Self Care | Payer: Self-pay | Attending: Emergency Medicine | Admitting: Emergency Medicine

## 2011-01-09 ENCOUNTER — Encounter (HOSPITAL_COMMUNITY): Payer: Self-pay | Admitting: *Deleted

## 2011-01-09 DIAGNOSIS — R300 Dysuria: Secondary | ICD-10-CM | POA: Insufficient documentation

## 2011-01-09 DIAGNOSIS — R10819 Abdominal tenderness, unspecified site: Secondary | ICD-10-CM | POA: Insufficient documentation

## 2011-01-09 DIAGNOSIS — N949 Unspecified condition associated with female genital organs and menstrual cycle: Secondary | ICD-10-CM | POA: Insufficient documentation

## 2011-01-09 DIAGNOSIS — Z8541 Personal history of malignant neoplasm of cervix uteri: Secondary | ICD-10-CM | POA: Insufficient documentation

## 2011-01-09 DIAGNOSIS — R112 Nausea with vomiting, unspecified: Secondary | ICD-10-CM | POA: Insufficient documentation

## 2011-01-09 DIAGNOSIS — R109 Unspecified abdominal pain: Secondary | ICD-10-CM | POA: Insufficient documentation

## 2011-01-09 LAB — CBC
HCT: 42.9 % (ref 36.0–46.0)
MCH: 31.7 pg (ref 26.0–34.0)
MCV: 93.1 fL (ref 78.0–100.0)
Platelets: 186 10*3/uL (ref 150–400)
RBC: 4.61 MIL/uL (ref 3.87–5.11)
WBC: 7.7 10*3/uL (ref 4.0–10.5)

## 2011-01-09 LAB — BASIC METABOLIC PANEL
BUN: 8 mg/dL (ref 6–23)
CO2: 26 mEq/L (ref 19–32)
Calcium: 9.2 mg/dL (ref 8.4–10.5)
Chloride: 106 mEq/L (ref 96–112)
Creatinine, Ser: 0.77 mg/dL (ref 0.50–1.10)
Glucose, Bld: 90 mg/dL (ref 70–99)

## 2011-01-09 LAB — WET PREP, GENITAL: Trich, Wet Prep: NONE SEEN

## 2011-01-09 LAB — URINALYSIS, ROUTINE W REFLEX MICROSCOPIC
Bilirubin Urine: NEGATIVE
Glucose, UA: NEGATIVE mg/dL
Hgb urine dipstick: NEGATIVE
Specific Gravity, Urine: 1.012 (ref 1.005–1.030)
Urobilinogen, UA: 0.2 mg/dL (ref 0.0–1.0)

## 2011-01-09 MED ORDER — HYDROMORPHONE HCL PF 1 MG/ML IJ SOLN
1.0000 mg | Freq: Once | INTRAMUSCULAR | Status: AC
  Administered 2011-01-09: 1 mg via INTRAVENOUS

## 2011-01-09 MED ORDER — HYDROMORPHONE HCL PF 1 MG/ML IJ SOLN
1.0000 mg | Freq: Once | INTRAMUSCULAR | Status: AC
  Administered 2011-01-09: 1 mg via INTRAVENOUS
  Filled 2011-01-09: qty 1

## 2011-01-09 MED ORDER — OXYCODONE-ACETAMINOPHEN 5-325 MG PO TABS: 1.0000 | ORAL_TABLET | ORAL | Status: AC | PRN

## 2011-01-09 MED ORDER — ONDANSETRON HCL 4 MG/2ML IJ SOLN
4.0000 mg | Freq: Once | INTRAMUSCULAR | Status: AC
  Administered 2011-01-09: 4 mg via INTRAVENOUS
  Filled 2011-01-09: qty 2

## 2011-01-09 MED ORDER — HYDROMORPHONE HCL PF 1 MG/ML IJ SOLN
1.0000 mg | Freq: Once | INTRAMUSCULAR | Status: DC
  Filled 2011-01-09: qty 1

## 2011-01-09 NOTE — ED Notes (Signed)
Pt reports a couple of days ago flair up of Urethral Divaticum. Seen last night at [facility] and sent here for evaluation by urology.

## 2011-01-09 NOTE — ED Provider Notes (Signed)
History     CSN: [id number] Arrival date & time: 01/09/2011  8:07 AM   First MD Initiated Contact with Patient 01/09/11 [phone]      Chief Complaint  Patient presents with  . Urinary Retention    (Consider location/radiation/quality/duration/timing/severity/associated sxs/prior treatment) Patient is a 45 y.o. female presenting with abdominal pain. The history is provided by the patient.  Abdominal Pain The primary symptoms of the illness include abdominal pain, nausea, vomiting and dysuria. The primary symptoms of the illness do not include fever, diarrhea, vaginal discharge or vaginal bleeding. The current episode started more than 2 days ago. The onset of the illness was gradual. The problem has been gradually worsening.  The abdominal pain began more than 2 days ago. The pain came on gradually. The abdominal pain is located in the suprapubic region. The abdominal pain radiates to the LLQ and RLQ. The severity of the abdominal pain is 10/10. The abdominal pain is relieved by nothing. The abdominal pain is exacerbated by urination.  The dysuria is associated with urgency.  The patient states that she believes she is currently not pregnant. The patient has not had a change in bowel habit. Additional symptoms associated with the illness include urgency. Symptoms associated with the illness do not include chills or constipation.  Pt states she was diagnosed with urethral diverticulum 7 mon ago. Burgess Estelle was seen at [facility] ED. Had CT abd/pelvis done, was told to be seen here today to see urologist. States that she feels like something is falling out of her vagina. Having problems emptying bladder.   Past Medical History  Diagnosis Date  . Herniated disc   . Asthma   . Arthritis   . Urethral diverticulum   . Cancer     cervical cancer s/p hysterectomy  . Cervical cancer   . Atrophic vaginitis   . Seizures   . Bipolar disorder     Past Surgical History  Procedure Date  .  Cesarean section   . Abdominal hysterectomy     cervical cancer age 86  . Total abdominal hysterectomy w/ bilateral salpingoophorectomy     Family History  Problem Relation Age of Onset  . Cancer Mother     ovarian, breast  . Heart disease Mother   . Cancer Father   . Cancer Sister     brain    History  Substance Use Topics  . Smoking status: Current Everyday Smoker -- 0.5 packs/day  . Smokeless tobacco: Not on file  . Alcohol Use: No    OB History    Grav Para Term Preterm Abortions TAB SAB Ect Mult Living                  Review of Systems  Constitutional: Negative for fever and chills.  HENT: Negative.   Eyes: Negative.   Respiratory: Negative.   Cardiovascular: Negative.   Gastrointestinal: Positive for nausea, vomiting and abdominal pain. Negative for diarrhea, constipation, blood in stool and rectal pain.  Genitourinary: Positive for dysuria, urgency, decreased urine volume, difficulty urinating and pelvic pain. Negative for vaginal bleeding and vaginal discharge.  Musculoskeletal: Negative.   Neurological: Negative.   Hematological: Negative.     Allergies  Sulfa antibiotics; Amoxicillin; and Penicillins  Home Medications   Current Outpatient Rx  Name Route Sig Dispense Refill  . ALBUTEROL SULFATE HFA 108 (90 BASE) MCG/ACT IN AERS Inhalation Inhale 2 puffs into the lungs every 4 (four) hours as needed.     Marland Kitchen ESTRADIOL  1 MG PO TABS Oral Take 1 mg by mouth daily.      Marland Kitchen GABAPENTIN 300 MG PO CAPS Oral Take 300 mg by mouth at bedtime. Patient takes 1 every morning and 2 at bedtime       BP 128/91  Pulse 87  Temp(Src) 98.7 F (37.1 C) (Oral)  Resp 20  SpO2 100%  Physical Exam  Constitutional: She is oriented to person, place, and time. She appears well-developed and well-nourished. She appears distressed.       Pt is crying and hollaring in pain.   HENT:  Head: Normocephalic and atraumatic.  Eyes: Pupils are equal, round, and reactive to light.    Cardiovascular: Normal rate, regular rhythm and normal heart sounds.   Pulmonary/Chest: Effort normal and breath sounds normal.  Abdominal: Soft. Bowel sounds are normal. There is tenderness.       Tenderness in the suprapubic area. Guarding  Genitourinary: Vagina normal and uterus normal. No vaginal discharge found.  Neurological: She is alert and oriented to person, place, and time.  Skin: Skin is warm and dry.  Psychiatric:       anxious    ED Course  Procedures (including critical care time)  Results for orders placed during the hospital encounter of 01/09/11  URINALYSIS, ROUTINE W REFLEX MICROSCOPIC      Component Value Range   Color, Urine YELLOW  YELLOW    Appearance CLEAR  CLEAR    Specific Gravity, Urine 1.012  1.005 - 1.030    pH 5.5  5.0 - 8.0    Glucose, UA NEGATIVE  NEGATIVE (mg/dL)   Hgb urine dipstick NEGATIVE  NEGATIVE    Bilirubin Urine NEGATIVE  NEGATIVE    Ketones, ur NEGATIVE  NEGATIVE (mg/dL)   Protein, ur NEGATIVE  NEGATIVE (mg/dL)   Urobilinogen, UA 0.2  0.0 - 1.0 (mg/dL)   Nitrite NEGATIVE  NEGATIVE    Leukocytes, UA NEGATIVE  NEGATIVE   CBC      Component Value Range   WBC 7.7  4.0 - 10.5 (K/uL)   RBC 4.61  3.87 - 5.11 (MIL/uL)   Hemoglobin 14.6  12.0 - 15.0 (g/dL)   HCT 46.9  62.9 - 52.8 (%)   MCV 93.1  78.0 - 100.0 (fL)   MCH 31.7  26.0 - 34.0 (pg)   MCHC 34.0  30.0 - 36.0 (g/dL)   RDW 41.3  24.4 - 01.0 (%)   Platelets 186  150 - 400 (K/uL)  BASIC METABOLIC PANEL      Component Value Range   Sodium 138  135 - 145 (mEq/L)   Potassium 4.0  3.5 - 5.1 (mEq/L)   Chloride 106  96 - 112 (mEq/L)   CO2 26  19 - 32 (mEq/L)   Glucose, Bld 90  70 - 99 (mg/dL)   BUN 8  6 - 23 (mg/dL)   Creatinine, Ser 2.72  0.50 - 1.10 (mg/dL)   Calcium 9.2  8.4 - 53.6 (mg/dL)   GFR calc non Af Amer >90  >90 (mL/min)   GFR calc Af Amer >90  >90 (mL/min)  WET PREP, GENITAL      Component Value Range   Yeast, Wet Prep NONE SEEN  NONE SEEN    Trich, Wet Prep NONE  SEEN  NONE SEEN    Clue Cells, Wet Prep FEW (*) NONE SEEN    WBC, Wet Prep HPF POC FEW (*) NONE SEEN    Pt had CT abd/pelvis done yesterday. Records obtained.  Negative for an acute process. Normal CT urethrogram a month ago. Normal UA here. Unremarkable pelvic exam and labs. Pt has urologist she is supposed to go see [facility]. Will get pain under cotrol and d/c home.     MDM   1:01 PM No elevated WBC. Normal CT abd/pelvis w/contrast yesterday. UA normal. VS normal. Pain controled with IV dilaudid. Pt comfortable going home. Will follow up with [facility] urology.        Lottie Mussel, PA 01/09/11 1302

## 2011-01-09 NOTE — ED Notes (Signed)
Pt. Reports that the EDP from [facility] informed her that she had yellow drainage from her urethra that stemmed from abcess  pocket in her urethra

## 2011-01-09 NOTE — ED Provider Notes (Signed)
Medical screening examination/treatment/procedure(s) were performed by non-physician practitioner and as supervising physician I was immediately available for consultation/collaboration.  The pt was discussed between Dr Juleen China and Lemont Fillers. i did not have direct interaction in this case  Lyanne Co, MD 01/09/11 1744

## 2011-01-10 LAB — GC/CHLAMYDIA PROBE AMP, GENITAL: GC Probe Amp, Genital: NEGATIVE

## 2011-01-15 ENCOUNTER — Other Ambulatory Visit: Payer: Self-pay | Admitting: Family Medicine

## 2011-01-15 MED ORDER — GABAPENTIN 300 MG PO CAPS: 300.0000 mg | ORAL_CAPSULE | Freq: Every day | ORAL | Status: DC

## 2011-02-01 DIAGNOSIS — N361 Urethral diverticulum: Secondary | ICD-10-CM | POA: Insufficient documentation

## 2011-02-11 DIAGNOSIS — J45909 Unspecified asthma, uncomplicated: Secondary | ICD-10-CM | POA: Insufficient documentation

## 2011-02-11 DIAGNOSIS — Z720 Tobacco use: Secondary | ICD-10-CM | POA: Insufficient documentation

## 2011-02-11 DIAGNOSIS — Z8719 Personal history of other diseases of the digestive system: Secondary | ICD-10-CM | POA: Insufficient documentation

## 2011-03-01 ENCOUNTER — Ambulatory Visit: Payer: Self-pay | Admitting: Family Medicine

## 2011-03-05 DIAGNOSIS — C6990 Malignant neoplasm of unspecified site of unspecified eye: Secondary | ICD-10-CM

## 2011-03-05 HISTORY — DX: Malignant neoplasm of unspecified site of unspecified eye: C69.90

## 2011-04-08 ENCOUNTER — Telehealth: Payer: Self-pay | Admitting: *Deleted

## 2011-04-08 NOTE — Telephone Encounter (Signed)
Ann-Social Worker with Hospice calling to see if Dr. Cristal Ford will touch base with Shelby Wang.  States patient's husband is under Hospice care and is not expected to live much longer and states [name] is struggling.  Requesting Dr. Cristal Ford to consider increasing patient's psychotropic medication.  Patient can be reached at [phone].  Ileana Ladd

## 2011-04-09 NOTE — Telephone Encounter (Signed)
Patient must be seen in order for meds to be assessed. She has no-showed the last 3 appointments with me in last several months. Please have patient make an appointment if she is interested in evaluation for this.

## 2011-04-10 NOTE — Telephone Encounter (Signed)
Patient is scheduled to see you 04/12/2011 @ 3pm.  Shelby Wang

## 2011-04-12 ENCOUNTER — Ambulatory Visit: Payer: Self-pay | Admitting: Family Medicine

## 2011-05-01 ENCOUNTER — Ambulatory Visit: Payer: Self-pay | Admitting: Family Medicine

## 2011-05-13 ENCOUNTER — Ambulatory Visit (INDEPENDENT_AMBULATORY_CARE_PROVIDER_SITE_OTHER): Payer: Self-pay | Admitting: Family Medicine

## 2011-05-13 ENCOUNTER — Encounter: Payer: Self-pay | Admitting: Family Medicine

## 2011-05-13 VITALS — BP 131/89 | HR 80 | Wt 114.5 lb

## 2011-05-13 DIAGNOSIS — F430 Acute stress reaction: Secondary | ICD-10-CM | POA: Insufficient documentation

## 2011-05-13 DIAGNOSIS — F438 Other reactions to severe stress: Secondary | ICD-10-CM

## 2011-05-13 DIAGNOSIS — F39 Unspecified mood [affective] disorder: Secondary | ICD-10-CM

## 2011-05-13 MED ORDER — LAMOTRIGINE 25 MG PO TABS: 25.0000 mg | ORAL_TABLET | Freq: Every day | ORAL | Status: AC

## 2011-05-13 MED ORDER — HYDROXYZINE HCL 25 MG PO TABS: 25.0000 mg | ORAL_TABLET | Freq: Every evening | ORAL | Status: AC | PRN

## 2011-05-13 NOTE — Assessment & Plan Note (Signed)
Patient always seems to have heightened emotions, today her husband's end-stage illness is a major factor, she usually cites some other form of turmoil in her life.  Have discussed initiation of lamictal for bipolar depression as a long term solution, and patient agrees for trial today. Will plan to taper off the neurontin also. Warned of side effects including sedation, rash. Will check labs at follow up visit.

## 2011-05-13 NOTE — Assessment & Plan Note (Signed)
Patient pleading for anxiety medication. Given the tremendous events going on her life, we discuss hydroxyzine qhs prn for sleep and changes with mood medication. Will avoid benzodiazepines if possible as patient may be at risk for abusing.

## 2011-05-13 NOTE — Patient Instructions (Signed)
Sorry for your loss. Normal to have grief and anxiety for a period. OK to use hydroxyzine at bedtime for sleep. Would like to try lamictal for mood and depression. Start one tablet daily. Decrease neurontin to one tablet twice daily, and may stop this altogether. Make an appointment in 1-2 weeks to check up. If you have skin rash, allergy to new med stop this immediately and call the office.

## 2011-05-13 NOTE — Progress Notes (Signed)
  Subjective:    Patient ID: Shelby Wang, female    DOB: 1966/02/23, 46 y.o.   MRN: [id number]  HPI  1. Anxiety/stress. Patient very tearful. Has been told by hospice nurse that her husband may not make it through the day today, has metastatic lung cancer, brain mets and nearing death. Hospice nurse Shelby Wang has told patient she is doing everything right, but encouraged patient to see MD for evaluation of anxiety and depression. Patient extremely emotional, sad in relating her observation of husband's slow decline. She is primary caretaker. Has difficulty sleeping and relaxing as she feels she must watch him constantly. Has depressed mood, anhedonia, poor energy and appetite. Pleads for something to help sleep, has used benzos previously-names klonopin and xanax. Denies SI/HI.   2. Bipolar DO. Treated by psychiatrist in the past, has not had time to take care of herself recently. Thinks the gabapentin helps with her mood swings, though this was originally prescribed for nerve pain. Sleep difficulty is most significant problem currently.  3. Pelvic pain. Pt states she underwent operative repair of bladder prolapse in [place] several weeks ago and her pelvic pain is improved now.   Review of Systems See HPI otherwise negative.     Objective:   Physical Exam  Vitals reviewed. Constitutional: She is oriented to person, place, and time. She appears well-developed and well-nourished.       Extremely upset and tearful, sobbing.  HENT:  Head: Normocephalic and atraumatic.  Mouth/Throat: Oropharynx is clear and moist.  Eyes: EOM are normal.  Pulmonary/Chest: Effort normal.  Musculoskeletal: She exhibits no edema.  Neurological: She is alert and oriented to person, place, and time. No cranial nerve deficit. She exhibits normal muscle tone.  Skin: No rash noted.      Assessment & Plan:

## 2011-10-31 ENCOUNTER — Emergency Department: Payer: Self-pay | Admitting: Unknown Physician Specialty

## 2011-10-31 LAB — COMPREHENSIVE METABOLIC PANEL
Anion Gap: 10 (ref 7–16)
Calcium, Total: 8.8 mg/dL (ref 8.5–10.1)
Co2: 26 mmol/L (ref 21–32)
Creatinine: 0.93 mg/dL (ref 0.60–1.30)
EGFR (African American): >60
EGFR (Non-African Amer.): >60
Osmolality: 296 (ref 275–301)
Potassium: 4 mmol/L (ref 3.5–5.1)
SGPT (ALT): 36 U/L (ref 12–78)
Sodium: 149 mmol/L — ABNORMAL HIGH (ref 136–145)
Total Protein: 7.3 g/dL (ref 6.4–8.2)

## 2011-10-31 LAB — CBC
HCT: 41.4 % (ref 35.0–47.0)
MCV: 93 fL (ref 80–100)
Platelet: 192 10*3/uL (ref 150–440)
RBC: 4.44 10*6/uL (ref 3.80–5.20)
WBC: 7.7 10*3/uL (ref 3.6–11.0)

## 2011-10-31 LAB — URINALYSIS, COMPLETE
Bilirubin,UR: NEGATIVE
Glucose,UR: NEGATIVE mg/dL (ref 0–75)
Leukocyte Esterase: NEGATIVE
Nitrite: NEGATIVE
Ph: 5 (ref 4.5–8.0)
RBC,UR: 239 /HPF (ref 0–5)
Specific Gravity: 1.026 (ref 1.003–1.030)
WBC UR: 2 /HPF (ref 0–5)

## 2012-03-02 ENCOUNTER — Emergency Department: Payer: Self-pay | Admitting: Emergency Medicine

## 2012-03-18 ENCOUNTER — Emergency Department: Payer: Self-pay | Admitting: Emergency Medicine

## 2012-03-18 LAB — ETHANOL
Ethanol %: <0.003 % (ref 0.000–0.080)
Ethanol: <3 mg/dL

## 2012-03-18 LAB — COMPREHENSIVE METABOLIC PANEL
Alkaline Phosphatase: 126 U/L (ref 50–136)
BUN: 9 mg/dL (ref 7–18)
Bilirubin,Total: 0.2 mg/dL (ref 0.2–1.0)
Calcium, Total: 8.6 mg/dL (ref 8.5–10.1)
Chloride: 107 mmol/L (ref 98–107)
Co2: 25 mmol/L (ref 21–32)
Creatinine: 0.81 mg/dL (ref 0.60–1.30)
EGFR (African American): >60
EGFR (Non-African Amer.): >60
Glucose: 113 mg/dL — ABNORMAL HIGH (ref 65–99)
Osmolality: 279 (ref 275–301)
Potassium: 3.8 mmol/L (ref 3.5–5.1)
SGOT(AST): 22 U/L (ref 15–37)
SGPT (ALT): 18 U/L (ref 12–78)
Total Protein: 7.4 g/dL (ref 6.4–8.2)

## 2012-03-18 LAB — DRUG SCREEN, URINE
Amphetamines, Ur Screen: NEGATIVE (ref ?–1000)
Benzodiazepine, Ur Scrn: NEGATIVE (ref ?–200)
Cocaine Metabolite,Ur ~~LOC~~: NEGATIVE (ref ?–300)
Methadone, Ur Screen: NEGATIVE (ref ?–300)
Phencyclidine (PCP) Ur S: NEGATIVE (ref ?–25)
Tricyclic, Ur Screen: NEGATIVE (ref ?–1000)

## 2012-03-18 LAB — CBC
HCT: 43 % (ref 35.0–47.0)
HGB: 14.7 g/dL (ref 12.0–16.0)
MCHC: 34.2 g/dL (ref 32.0–36.0)
WBC: 7 10*3/uL (ref 3.6–11.0)

## 2012-03-18 LAB — URINALYSIS, COMPLETE
Blood: NEGATIVE
Ketone: NEGATIVE
Leukocyte Esterase: NEGATIVE
Nitrite: NEGATIVE
Protein: NEGATIVE

## 2012-03-18 LAB — TSH: Thyroid Stimulating Horm: 0.99 u[IU]/mL

## 2012-04-10 IMAGING — CR DG HIP (WITH OR WITHOUT PELVIS) 2-3V*L*
3 series · 3 of 3 positions shown · non-contrast
Comparison: Pelvic CT 08/27/2010.

CLINICAL DATA: Left groin and hip pain.

LEFT HIP - COMPLETE 2+ VIEW

[t pelvis a.p.]
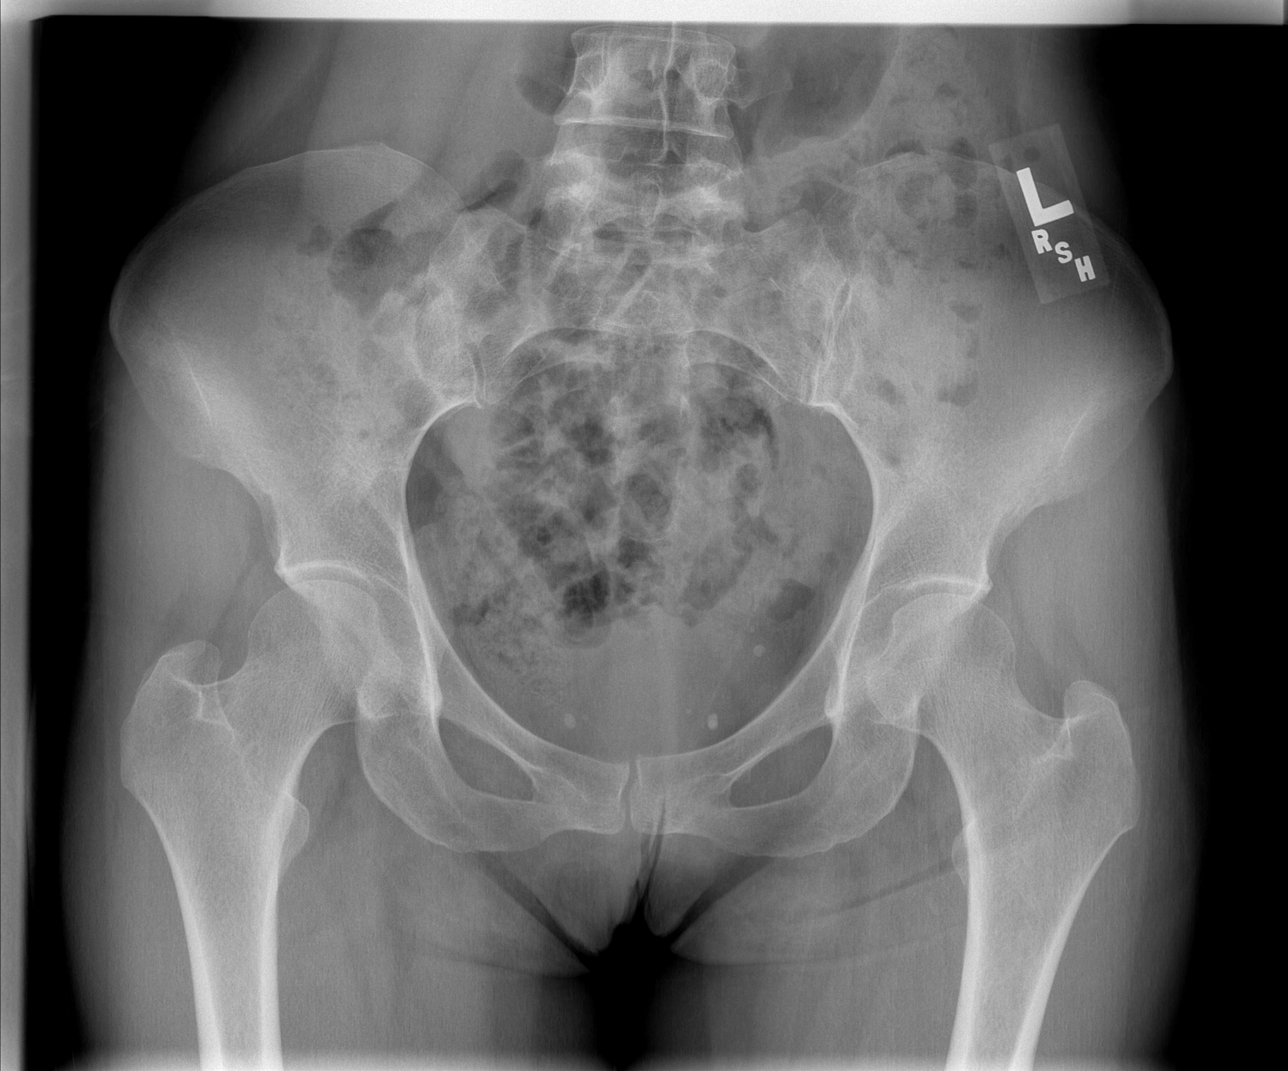

[t hip ap left]
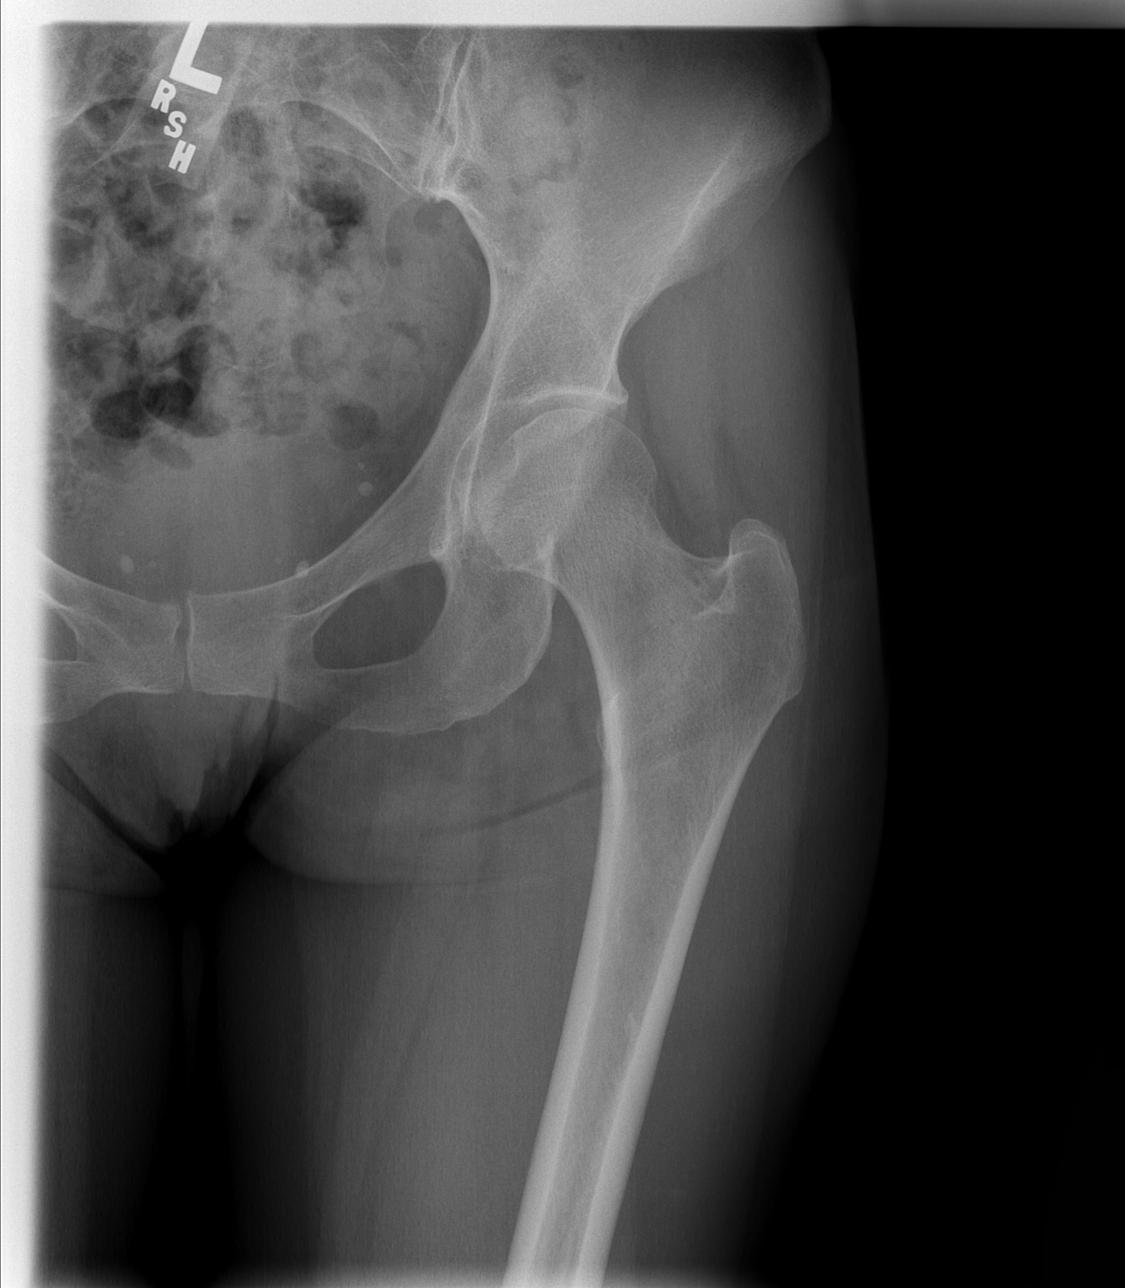

[t hip frog leg left]
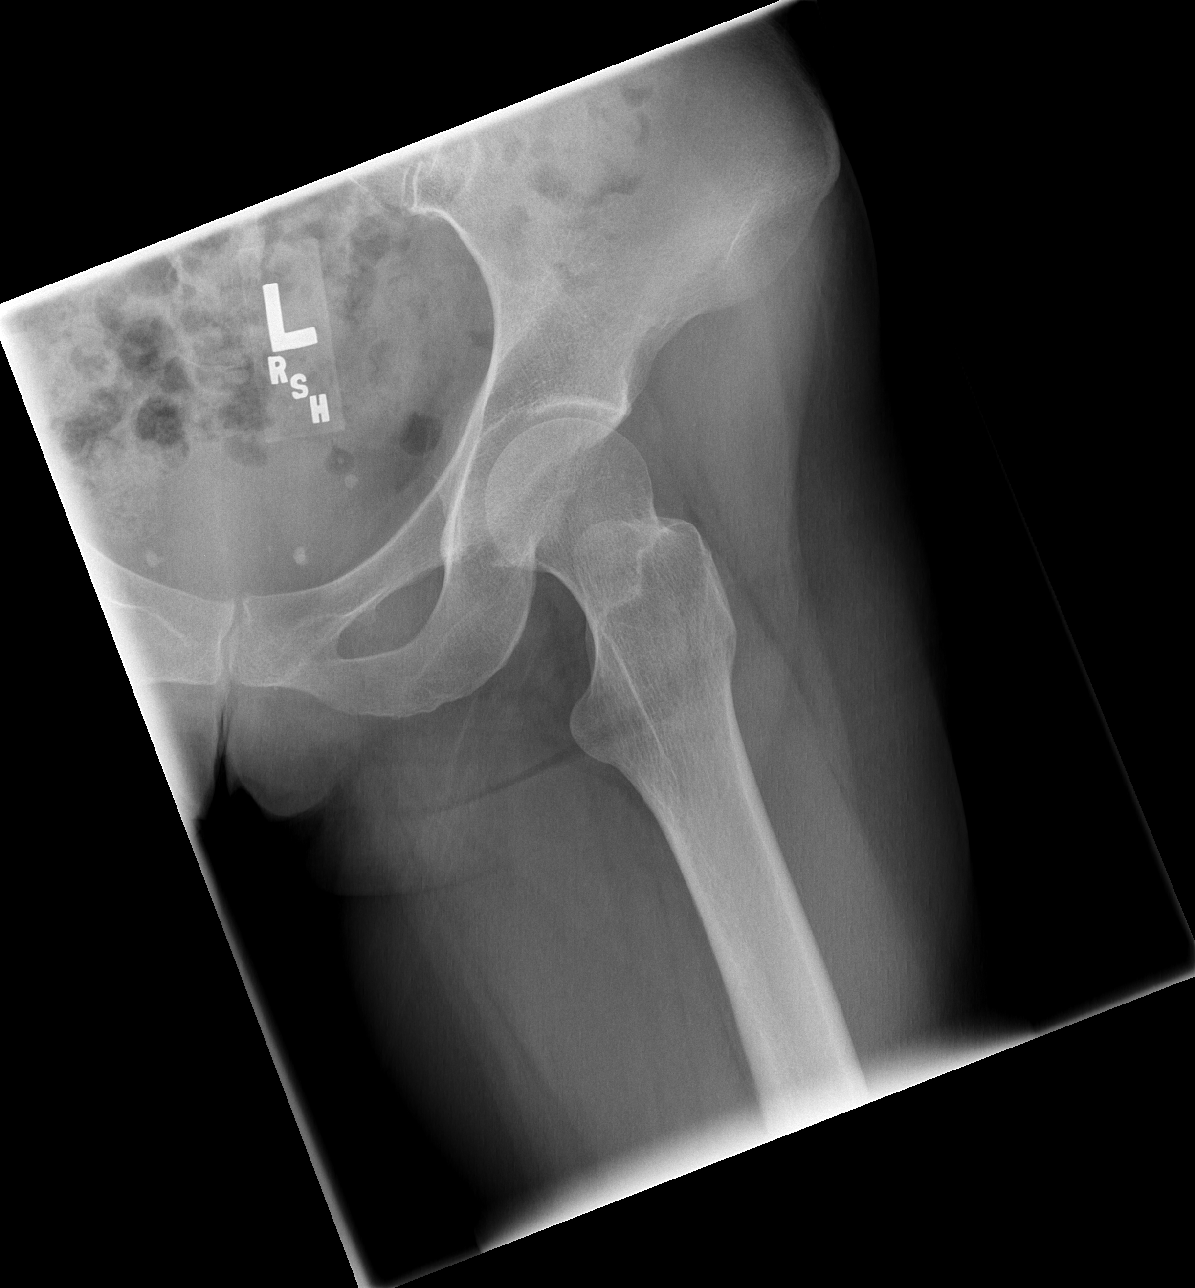

[3 of 3 positions shown; findings below may reference images not displayed]

FINDINGS: The mineralization and alignment are normal.  There is no
evidence of acute fracture or dislocation.  There is no evidence of
femoral head osteonecrosis.  Hip and sacroiliac joints appear
normal.  Bilateral pelvic phleboliths are unchanged.
IMPRESSION: Normal examination.

## 2012-04-10 IMAGING — CR DG LUMBAR SPINE 2-3V
3 series · 3 of 3 positions shown · non-contrast
Comparison: Abdominal CT 08/27/2010.

CLINICAL DATA: Left hip and groin pain with SI joint tenderness.

LUMBAR SPINE - 2-3 VIEW

[t l-spine a.p.]
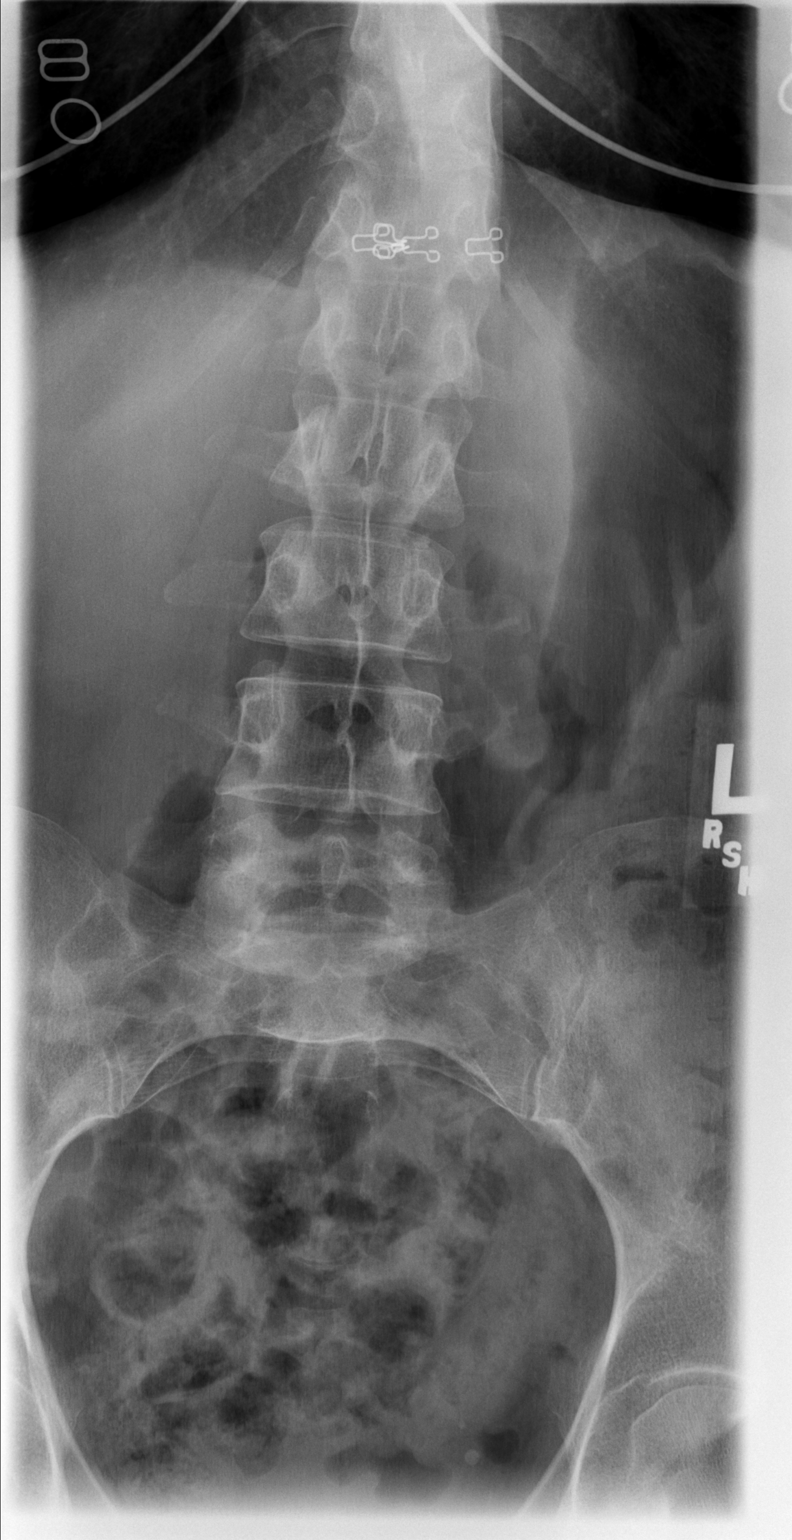

[t l-spine lat]
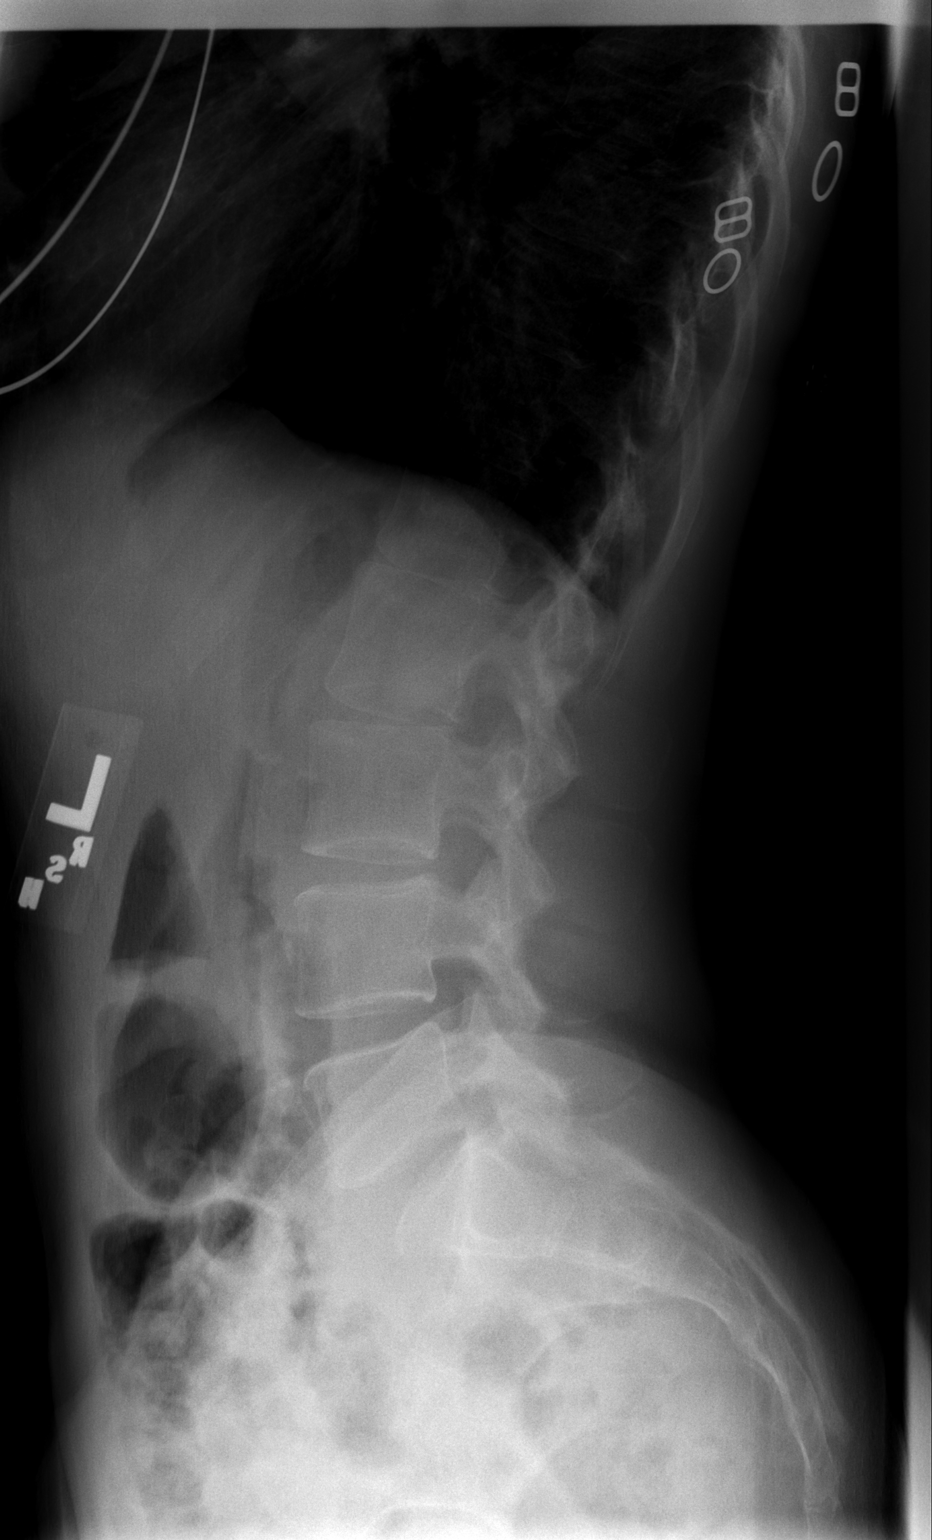

[t l-spine l5-s1 spot]
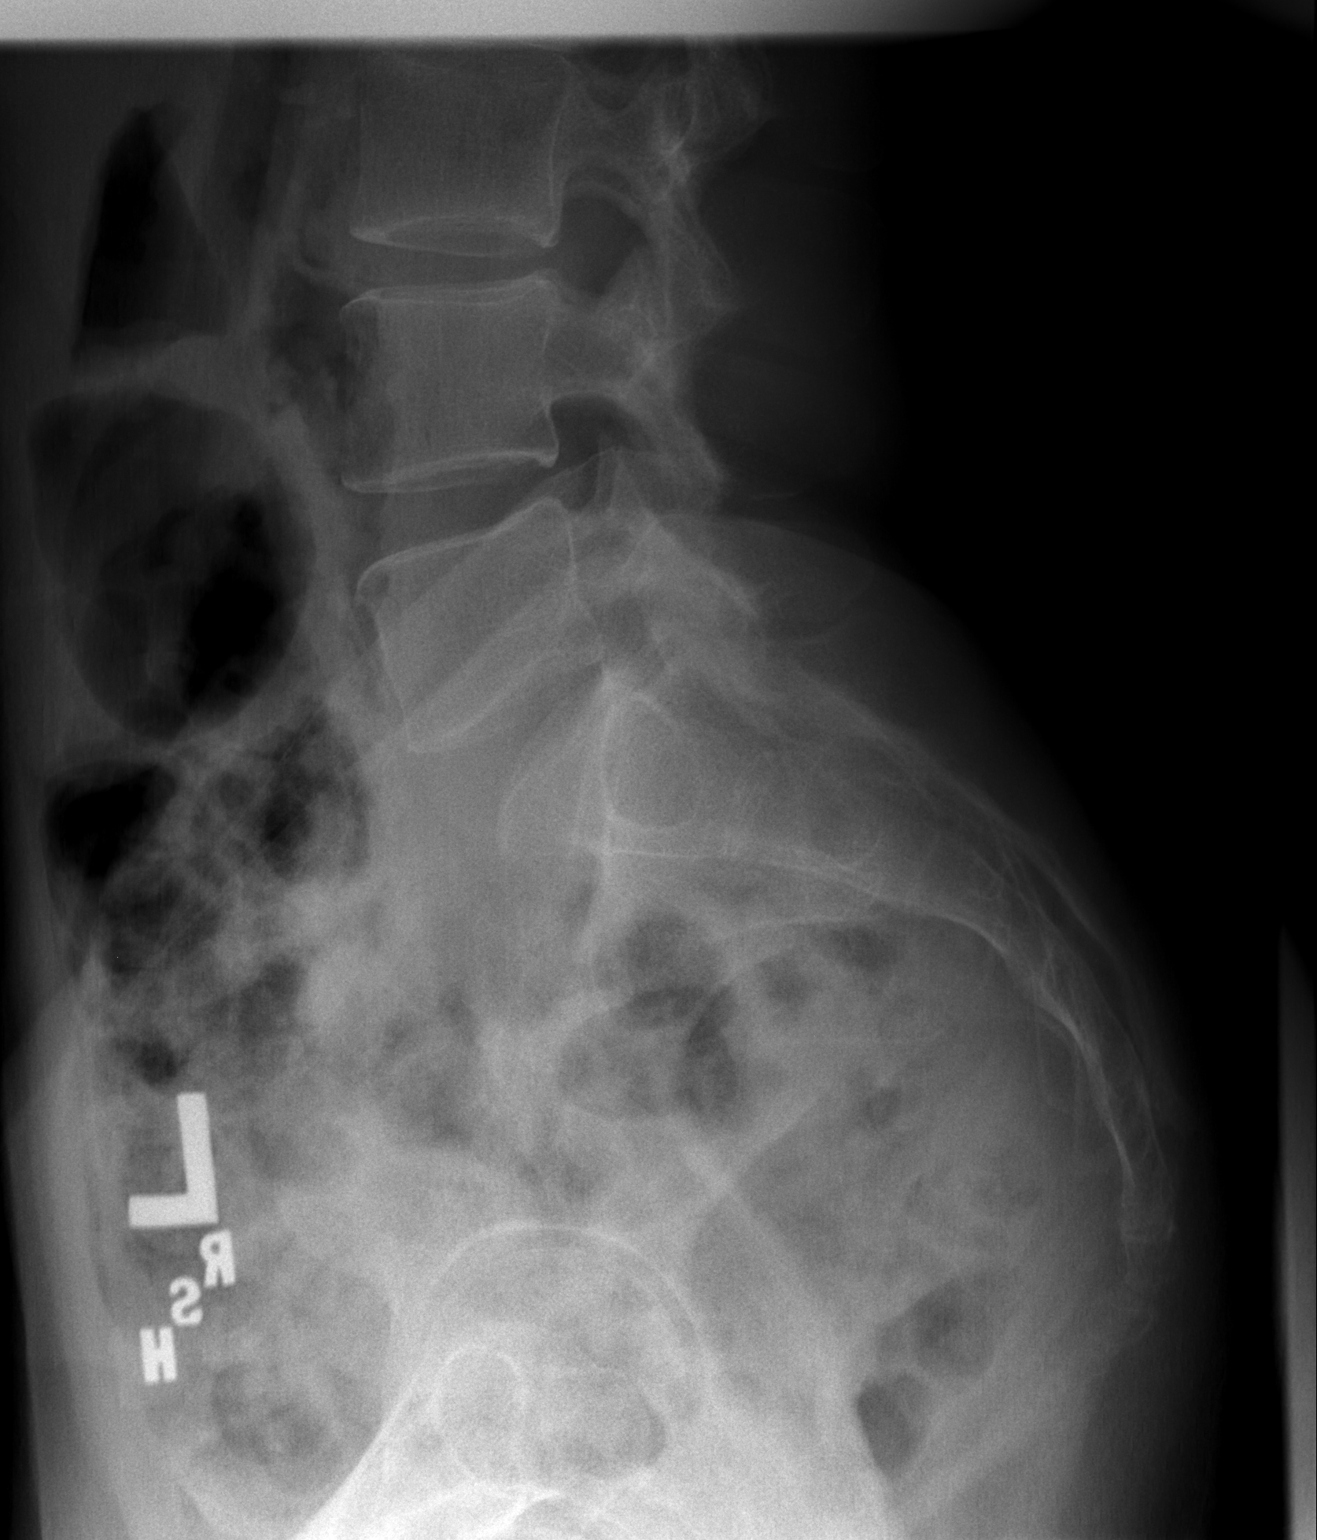

[3 of 3 positions shown; findings below may reference images not displayed]

FINDINGS: There are five lumbar type vertebral bodies.  The
alignment is normal.  There is no evidence of fracture, pars defect
or significant disc space allowing for positioning.  The sacroiliac
joints appear normal.
IMPRESSION: No significant findings.

## 2012-08-31 ENCOUNTER — Emergency Department: Payer: Self-pay | Admitting: Internal Medicine

## 2013-04-05 ENCOUNTER — Encounter (HOSPITAL_COMMUNITY): Payer: Self-pay | Admitting: Emergency Medicine

## 2013-04-05 ENCOUNTER — Emergency Department (HOSPITAL_COMMUNITY)
Admission: EM | Admit: 2013-04-05 | Discharge: 2013-04-05 | Disposition: A | Discharge status: Home / Self Care | Payer: Self-pay | Attending: Emergency Medicine | Admitting: Emergency Medicine

## 2013-04-05 DIAGNOSIS — R209 Unspecified disturbances of skin sensation: Secondary | ICD-10-CM | POA: Insufficient documentation

## 2013-04-05 DIAGNOSIS — Z87448 Personal history of other diseases of urinary system: Secondary | ICD-10-CM | POA: Insufficient documentation

## 2013-04-05 DIAGNOSIS — Z8669 Personal history of other diseases of the nervous system and sense organs: Secondary | ICD-10-CM | POA: Insufficient documentation

## 2013-04-05 DIAGNOSIS — Z79899 Other long term (current) drug therapy: Secondary | ICD-10-CM | POA: Insufficient documentation

## 2013-04-05 DIAGNOSIS — S61219A Laceration without foreign body of unspecified finger without damage to nail, initial encounter: Secondary | ICD-10-CM

## 2013-04-05 DIAGNOSIS — Z8742 Personal history of other diseases of the female genital tract: Secondary | ICD-10-CM | POA: Insufficient documentation

## 2013-04-05 DIAGNOSIS — G8911 Acute pain due to trauma: Secondary | ICD-10-CM | POA: Insufficient documentation

## 2013-04-05 DIAGNOSIS — J029 Acute pharyngitis, unspecified: Secondary | ICD-10-CM | POA: Insufficient documentation

## 2013-04-05 DIAGNOSIS — Z8739 Personal history of other diseases of the musculoskeletal system and connective tissue: Secondary | ICD-10-CM | POA: Insufficient documentation

## 2013-04-05 DIAGNOSIS — R51 Headache: Secondary | ICD-10-CM | POA: Insufficient documentation

## 2013-04-05 DIAGNOSIS — R197 Diarrhea, unspecified: Secondary | ICD-10-CM | POA: Insufficient documentation

## 2013-04-05 DIAGNOSIS — J45909 Unspecified asthma, uncomplicated: Secondary | ICD-10-CM | POA: Insufficient documentation

## 2013-04-05 DIAGNOSIS — M25549 Pain in joints of unspecified hand: Secondary | ICD-10-CM | POA: Insufficient documentation

## 2013-04-05 DIAGNOSIS — F17200 Nicotine dependence, unspecified, uncomplicated: Secondary | ICD-10-CM | POA: Insufficient documentation

## 2013-04-05 DIAGNOSIS — R112 Nausea with vomiting, unspecified: Secondary | ICD-10-CM | POA: Insufficient documentation

## 2013-04-05 DIAGNOSIS — Z880 Allergy status to penicillin: Secondary | ICD-10-CM | POA: Insufficient documentation

## 2013-04-05 DIAGNOSIS — Z8659 Personal history of other mental and behavioral disorders: Secondary | ICD-10-CM | POA: Insufficient documentation

## 2013-04-05 DIAGNOSIS — Z8541 Personal history of malignant neoplasm of cervix uteri: Secondary | ICD-10-CM | POA: Insufficient documentation

## 2013-04-05 DIAGNOSIS — M25449 Effusion, unspecified hand: Secondary | ICD-10-CM | POA: Insufficient documentation

## 2013-04-05 DIAGNOSIS — J438 Other emphysema: Secondary | ICD-10-CM | POA: Insufficient documentation

## 2013-04-05 HISTORY — DX: Chronic obstructive pulmonary disease, unspecified: J44.9

## 2013-04-05 HISTORY — DX: Emphysema, unspecified: J43.9

## 2013-04-05 MED ORDER — DOXYCYCLINE HYCLATE 100 MG PO CAPS: 100.0000 mg | ORAL_CAPSULE | Freq: Two times a day (BID) | ORAL | Status: DC

## 2013-04-05 NOTE — Discharge Instructions (Signed)
Read the information below.  Use the prescribed medication as directed.  Please discuss all new medications with your pharmacist.  You may return to the Emergency Department at any time for worsening condition or any new symptoms that concern you.  If you develop redness, swelling, pus draining from the wound, or fevers greater than 100.4, return to the ER immediately for a recheck.     Laceration Care, Adult A laceration is a cut or lesion that goes through all layers of the skin and into the tissue just beneath the skin. TREATMENT  Some lacerations may not require closure. Some lacerations may not be able to be closed due to an increased risk of infection. It is important to see your caregiver as soon as possible after an injury to minimize the risk of infection and maximize the opportunity for successful closure. If closure is appropriate, pain medicines may be given, if needed. The wound will be cleaned to help prevent infection. Your caregiver will use stitches (sutures), staples, wound glue (adhesive), or skin adhesive strips to repair the laceration. These tools bring the skin edges together to allow for faster healing and a better cosmetic outcome. However, all wounds will heal with a scar. Once the wound has healed, scarring can be minimized by covering the wound with sunscreen during the day for 1 full year. HOME CARE INSTRUCTIONS  For sutures or staples:  Keep the wound clean and dry.  If you were given a bandage (dressing), you should change it at least once a day. Also, change the dressing if it becomes wet or dirty, or as directed by your caregiver.  Wash the wound with soap and water 2 times a day. Rinse the wound off with water to remove all soap. Pat the wound dry with a clean towel.  After cleaning, apply a thin layer of the antibiotic ointment as recommended by your caregiver. This will help prevent infection and keep the dressing from sticking.  You may shower as usual after  the first 24 hours. Do not soak the wound in water until the sutures are removed.  Only take over-the-counter or prescription medicines for pain, discomfort, or fever as directed by your caregiver.  Get your sutures or staples removed as directed by your caregiver. For skin adhesive strips:  Keep the wound clean and dry.  Do not get the skin adhesive strips wet. You may bathe carefully, using caution to keep the wound dry.  If the wound gets wet, pat it dry with a clean towel.  Skin adhesive strips will fall off on their own. You may trim the strips as the wound heals. Do not remove skin adhesive strips that are still stuck to the wound. They will fall off in time. For wound adhesive:  You may briefly wet your wound in the shower or bath. Do not soak or scrub the wound. Do not swim. Avoid periods of heavy perspiration until the skin adhesive has fallen off on its own. After showering or bathing, gently pat the wound dry with a clean towel.  Do not apply liquid medicine, cream medicine, or ointment medicine to your wound while the skin adhesive is in place. This may loosen the film before your wound is healed.  If a dressing is placed over the wound, be careful not to apply tape directly over the skin adhesive. This may cause the adhesive to be pulled off before the wound is healed.  Avoid prolonged exposure to sunlight or tanning lamps while the  skin adhesive is in place. Exposure to ultraviolet light in the first year will darken the scar.  The skin adhesive will usually remain in place for 5 to 10 days, then naturally fall off the skin. Do not pick at the adhesive film. You may need a tetanus shot if:  You cannot remember when you had your last tetanus shot.  You have never had a tetanus shot. If you get a tetanus shot, your arm may swell, get red, and feel warm to the touch. This is common and not a problem. If you need a tetanus shot and you choose not to have one, there is a rare  chance of getting tetanus. Sickness from tetanus can be serious. SEEK MEDICAL CARE IF:   You have redness, swelling, or increasing pain in the wound.  You see a red line that goes away from the wound.  You have yellowish-white fluid (pus) coming from the wound.  You have a fever.  You notice a bad smell coming from the wound or dressing.  Your wound breaks open before or after sutures have been removed.  You notice something coming out of the wound such as wood or glass.  Your wound is on your hand or foot and you cannot move a finger or toe. SEEK IMMEDIATE MEDICAL CARE IF:   Your pain is not controlled with prescribed medicine.  You have severe swelling around the wound causing pain and numbness or a change in color in your arm, hand, leg, or foot.  Your wound splits open and starts bleeding.  You have worsening numbness, weakness, or loss of function of any joint around or beyond the wound.  You develop painful lumps near the wound or on the skin anywhere on your body. MAKE SURE YOU:   Understand these instructions.  Will watch your condition.  Will get help right away if you are not doing well or get worse. Document Released: 02/18/2005 Document Revised: 05/13/2011 Document Reviewed: 08/14/2010 [facility] Patient Information 2014 [place], [place].

## 2013-04-05 NOTE — ED Provider Notes (Signed)
CSN: [id number]     Arrival date & time 04/05/13  1205 History  This chart was scribed for non-physician practitioner, Clayton Bibles, PA-C working with Blanchard Kelch, MD by Frederich Balding, ED scribe. This patient was seen in room WTR8/WTR8 and the patient's care was started at 1:22 PM.   Chief Complaint  Patient presents with  . Hand Pain   The history is provided by the patient. No language interpreter was used.   HPI Comments: Shelby Wang is a 48 y.o. female who presents to the Emergency Department complaining of gradual onset, constant left index finger pain with associated swelling that started about 1.5 weeks ago. Movement of her fingers worsens the pain. Pt states she accidentally cut her finger and was seen in an outside ED where the laceration was covered with dermabond. She states she later developed numbness in her right thumb and right index and middle fingertips. Pt is also having nausea, emesis, diarrhea, sore throat and headache that started 3 days ago. She has sick contacts at home with similar symptoms. Pt had a subjective fever yesterday and states it was relieved by tylenol. Denies neck pain. 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   Past Medical History  Diagnosis Date  . Herniated disc   . Asthma   . Arthritis   . Urethral diverticulum   . Cancer     cervical cancer s/p hysterectomy  . Cervical cancer   . Atrophic vaginitis   . Seizures   . Bipolar disorder   . COPD (chronic obstructive pulmonary disease)   . Emphysema/COPD    Past Surgical History  Procedure Laterality Date  . Cesarean section    . Abdominal hysterectomy      cervical cancer age 9  . Total abdominal hysterectomy w/ bilateral salpingoophorectomy    . Vaginal prolapse repair  2012   Family History  Problem Relation Age of Onset  . Cancer Mother     ovarian, breast  . Heart disease Mother   . Cancer Father   . Cancer Sister     brain   History  Substance Use Topics  . Smoking status: Current  Every Day Smoker -- 1.00 packs/day  . Smokeless tobacco: Not on file  . Alcohol Use: No   OB History   Grav Para Term Preterm Abortions TAB SAB Ect Mult Living                 Review of Systems  Constitutional: Negative for fever.  HENT: Positive for sore throat.   Gastrointestinal: Positive for nausea, vomiting and diarrhea.  Musculoskeletal: Positive for arthralgias and joint swelling. Negative for neck pain.  Neurological: Positive for numbness and headaches.  All other systems reviewed and are negative.    Allergies  Codeine; Nsaids; Sulfa antibiotics; Amoxicillin; and Penicillins  Home Medications   Current Outpatient Rx  Name  Route  Sig  Dispense  Refill  . albuterol (VENTOLIN HFA) 108 (90 BASE) MCG/ACT inhaler   Inhalation   Inhale 2 puffs into the lungs every 4 (four) hours as needed.           BP 148/86  Pulse 73  Temp(Src) 98 F (36.7 C) (Oral)  Resp 16  SpO2 99%  Physical Exam  Nursing note and vitals reviewed. Constitutional: She appears well-developed and well-nourished. No distress.  HENT:  Head: Normocephalic and atraumatic.  Neck: Neck supple.  Pulmonary/Chest: Effort normal.  Musculoskeletal:  Left second PIP dorsally with healing laceration. Scab intact.  Surrounding mild pink skin. No discharge. No significant edema of the finger. No areas of induration or fluctuance. Pt has decreased ROM of the PIP and DIP of this finger secondary to pain. Capillary refill is less than 2 seconds in all digits. Left thumb exam is inconsistent. Had strength in thumb prior to requested testing, specific request to move it and pt was unable to. Capillary refill less than 2 seconds. Other digits with full ROM. Pt reports distal decreased sensation in third finger. Radial pulse intact. No tenderness in hand or arm. No red streaks or erythema otherwise.   Neurological: She is alert.  Skin: She is not diaphoretic.    ED Course  Procedures (including critical care  time)  DIAGNOSTIC STUDIES: Oxygen Saturation is 99% on RA, normal by my interpretation.    COORDINATION OF CARE: 1:26 PM-Discussed treatment plan which includes speaking with Dr. Aline Brochure with pt at bedside and pt agreed to plan.  1:32 PM-Dicussed pt with Dr. Aline Brochure who also examined the patient. He suggested putting her on doxycycline.   Labs Review Labs Reviewed - No data to display Imaging Review No results found.  EKG Interpretation   None       MDM   1. Finger laceration    Pt p/w complaint of finger pain, concern for infection of laceration repaired at an outside facility.  There are no signs of infection.  No e/o cellulitis and doubt underlying abscess. Patient's complaints of numbness in her 1st and 3rd finger do not make sense in this clinical setting and I cannot find a reason for this.  Her strength testing is also inconsistent.  She previously had strength in the thumb then said she was unable to move it.  She has no weakness proximally.  She has no neck pain and no other injury.  She was attributing her other symptoms to her finger but I think she has likely developed the illness that her grandchildren have at home.  Pt also seen by Dr Aline Brochure who agrees with my assessment but recommends given doxycycline.  PCP follow up.  Discussed findings, treatment, and follow up  with patient.  Pt given return precautions.  Pt verbalizes understanding and agrees with plan.      I personally performed the services described in this documentation, which was scribed in my presence. The recorded information has been reviewed and is accurate.   Clayton Bibles, PA-C 04/05/13 1757

## 2013-04-05 NOTE — ED Notes (Addendum)
Pt states she was cutting speaker wire and cut right index finger with wire. Went to an emergency room and they derma bond it. States now finger is painful and now her thumb, index and middle finger tips are numb. Pt has had MRSA in the past

## 2013-04-06 NOTE — ED Provider Notes (Signed)
Medical screening examination/treatment/procedure(s) were conducted as a shared visit with non-physician practitioner(s) and myself.  I personally evaluated the patient during the encounter.  EKG Interpretation   None       I interviewed and examined the patient. Lungs are CTAB. Cardiac exam wnl. Abdomen soft. Healing laceration on left second dorsal PIP. Mild erythema here. Doubt infection, but given pt's pain will recommend course of abx. Paresthesias and weakness are inconsistent on exam. Likely related to pain rather than actual neurologic deficit. She has normal strength in her wrist, forearms, biceps, triceps on my exam.   Blanchard Kelch, MD 04/06/13 1239

## 2013-08-25 ENCOUNTER — Ambulatory Visit: Payer: Self-pay

## 2013-09-07 ENCOUNTER — Encounter: Payer: Self-pay | Admitting: General Surgery

## 2013-09-20 ENCOUNTER — Encounter: Payer: Self-pay | Admitting: General Surgery

## 2013-09-20 ENCOUNTER — Ambulatory Visit (INDEPENDENT_AMBULATORY_CARE_PROVIDER_SITE_OTHER): Payer: PRIVATE HEALTH INSURANCE | Admitting: General Surgery

## 2013-09-20 VITALS — BP 124/72 | HR 74 | Resp 14 | Ht <= 58 in | Wt 133.0 lb

## 2013-09-20 DIAGNOSIS — N6459 Other signs and symptoms in breast: Secondary | ICD-10-CM

## 2013-09-20 DIAGNOSIS — N6452 Nipple discharge: Secondary | ICD-10-CM

## 2013-09-20 DIAGNOSIS — Z803 Family history of malignant neoplasm of breast: Secondary | ICD-10-CM

## 2013-09-20 NOTE — Patient Instructions (Addendum)
Patient to return in 2 months. Advised benign nature of green discharge. Stress the importance of generic testing.

## 2013-09-20 NOTE — Progress Notes (Signed)
Patient ID: Shelby Wang, female   DOB: 04-24-1965, 48 y.o.   MRN: [id number]  Chief Complaint  Patient presents with  . Other    mammogram    HPI Shelby Wang is a 48 y.o. female.  who presents for a breast evaluation. The most recent mammogram and right breast ultrasound was done on 08/25/13 at [facility]. Patient states she is having green discharge from her right  breast for three months. Strong family history of breast and ovarian cancer. Has not  had genetic testing done. Patient does perform regular self breast checks but does not  gets regular mammograms done.    HPI  Past Medical History  Diagnosis Date  . Herniated disc   . Asthma   . Arthritis   . Urethral diverticulum   . Cancer     cervical cancer s/p hysterectomy  . Cervical cancer   . Atrophic vaginitis   . Seizures   . Bipolar disorder   . COPD (chronic obstructive pulmonary disease)   . Emphysema/COPD   . 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 Cesarean section    . Abdominal hysterectomy      cervical cancer age 44  . Total abdominal hysterectomy w/ bilateral salpingoophorectomy    . Vaginal prolapse repair  2012    Family History  Problem Relation Age of Onset  . Cancer Mother     ovarian, breast  . Heart disease Mother   . Cancer Father   . Cancer Sister     brain  . Breast cancer Sister   . Breast cancer Maternal Aunt     Social History History  Substance Use Topics  . Smoking status: Current Every Day Smoker -- 1.00 packs/day  . Smokeless tobacco: Never Used  . Alcohol Use: No    Allergies  Allergen Reactions  . Codeine Hives  . Nsaids Other (See Comments)    Upset stomach  . Sulfa Antibiotics Hives  . Amoxicillin Rash  . Penicillins Rash    Current Outpatient Prescriptions  Medication Sig Dispense Refill  . albuterol (VENTOLIN HFA) 108 (90 BASE) MCG/ACT inhaler Inhale 2 puffs into the lungs every 4 (four) hours as needed.        No  current facility-administered medications for this visit.    Review of Systems Review of Systems  Constitutional: Negative.   Respiratory: Positive for chest tightness, shortness of breath and wheezing. Negative for cough, choking and stridor.   Cardiovascular: Negative.     Blood pressure 124/72, pulse 74, resp. rate 14, height 4\' 9"  (1.448 m), weight 133 lb (60.328 kg).  Physical Exam Physical Exam  Constitutional: She is oriented to person, place, and time. She appears well-developed and well-nourished.  Eyes: Conjunctivae are normal. No scleral icterus.  Neck: Neck supple.  Cardiovascular: Normal rate, regular rhythm and normal heart sounds.   Pulmonary/Chest: Effort normal. She has wheezes ( bilateral lungs). Right breast exhibits no inverted nipple, no mass, no nipple discharge, no skin change and no tenderness. Left breast exhibits no inverted nipple, no mass, no nipple discharge, no skin change and no tenderness.  1 cm soft mass at 5 o'clock right breast felt in 2011 and remains unchanged.  Neurological: She is alert and oriented to person, place, and time.  Skin: Skin is warm and dry.    Data Reviewed Mammogram and right breast ultrasound reviewed.   Assessment    No  discharge in the exam today .Imaging is normal, a small lipoma in the right breast unchanged since 2011.     Plan   Patient to return in 2 months recheck. She failed to f/u in 2011 after one evaluation here. She had to care for her husband who had cancer and put her care on hold. She is willing to have genetic test done now. Will inform [facility] to arrange this. Pt is aware her exam now is normal but needs close f/u.        Dailey Alberson G 09/21/2013, 8:19 PM

## 2013-09-21 ENCOUNTER — Encounter: Payer: Self-pay | Admitting: General Surgery

## 2013-09-21 DIAGNOSIS — Z803 Family history of malignant neoplasm of breast: Secondary | ICD-10-CM | POA: Insufficient documentation

## 2013-10-20 ENCOUNTER — Ambulatory Visit: Payer: Self-pay | Admitting: Oncology

## 2013-10-26 ENCOUNTER — Other Ambulatory Visit: Payer: Self-pay | Admitting: Oncology

## 2013-10-26 DIAGNOSIS — Z1501 Genetic susceptibility to malignant neoplasm of breast: Secondary | ICD-10-CM

## 2013-10-26 DIAGNOSIS — Z1509 Genetic susceptibility to other malignant neoplasm: Principal | ICD-10-CM

## 2013-11-01 ENCOUNTER — Ambulatory Visit
Admission: RE | Admit: 2013-11-01 | Discharge: 2013-11-01 | Disposition: A | Discharge status: Home / Self Care | Payer: No Typology Code available for payment source | Source: Ambulatory Visit | Attending: Oncology | Admitting: Oncology

## 2013-11-01 DIAGNOSIS — Z1501 Genetic susceptibility to malignant neoplasm of breast: Secondary | ICD-10-CM

## 2013-11-01 DIAGNOSIS — Z1509 Genetic susceptibility to other malignant neoplasm: Principal | ICD-10-CM

## 2013-11-01 MED ORDER — GADOBENATE DIMEGLUMINE 529 MG/ML IV SOLN
12.0000 mL | Freq: Once | INTRAVENOUS | Status: AC | PRN
  Administered 2013-11-01: 12 mL via INTRAVENOUS

## 2013-11-02 ENCOUNTER — Ambulatory Visit: Payer: Self-pay | Admitting: Oncology

## 2013-11-09 ENCOUNTER — Encounter: Payer: Self-pay | Admitting: General Surgery

## 2013-11-09 ENCOUNTER — Ambulatory Visit (INDEPENDENT_AMBULATORY_CARE_PROVIDER_SITE_OTHER): Payer: PRIVATE HEALTH INSURANCE | Admitting: General Surgery

## 2013-11-09 ENCOUNTER — Other Ambulatory Visit: Payer: PRIVATE HEALTH INSURANCE

## 2013-11-09 VITALS — BP 124/76 | HR 76 | Temp 98.0°F | Resp 16 | Ht <= 58 in | Wt 136.0 lb

## 2013-11-09 DIAGNOSIS — Z1501 Genetic susceptibility to malignant neoplasm of breast: Secondary | ICD-10-CM | POA: Insufficient documentation

## 2013-11-09 DIAGNOSIS — Z1509 Genetic susceptibility to other malignant neoplasm: Secondary | ICD-10-CM

## 2013-11-09 DIAGNOSIS — N6459 Other signs and symptoms in breast: Secondary | ICD-10-CM

## 2013-11-09 DIAGNOSIS — Z1502 Genetic susceptibility to malignant neoplasm of ovary: Secondary | ICD-10-CM

## 2013-11-09 DIAGNOSIS — N6452 Nipple discharge: Secondary | ICD-10-CM

## 2013-11-09 NOTE — Progress Notes (Signed)
Patient ID: Shelby Wang, female   DOB: Oct 26, 1965, 48 y.o.   MRN: [id number]  Chief Complaint  Patient presents with  . Follow-up    2 month follow up nipple discharge    HPI Shelby Wang is a 48 y.o. female who presents for a 2 month follow up of nipple discharge. She is still currently having discharge. She went for a bilateral breast MRI on 11/01/13. She has had genetic testing done which was positive (BRCA 1). She denies any new problems with the breasts at this time.   HPI  Past Medical History  Diagnosis Date  . Herniated disc   . Asthma   . Arthritis   . Urethral diverticulum   . Cancer     cervical cancer s/p hysterectomy  . Cervical cancer   . Atrophic vaginitis   . Seizures   . Bipolar disorder   . COPD (chronic obstructive pulmonary disease)   . Emphysema/COPD   . 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 Cesarean section    . Abdominal hysterectomy      cervical cancer age 16  . Total abdominal hysterectomy w/ bilateral salpingoophorectomy    . Vaginal prolapse repair  2012    Family History  Problem Relation Age of Onset  . Cancer Mother     ovarian, breast  . Heart disease Mother   . Cancer Father   . Cancer Sister     brain  . Breast cancer Sister   . Breast cancer Maternal Aunt     Social History History  Substance Use Topics  . Smoking status: Current Every Day Smoker -- 1.00 packs/day  . Smokeless tobacco: Never Used  . Alcohol Use: No    Allergies  Allergen Reactions  . Codeine Hives  . Nsaids Other (See Comments)    Upset stomach  . Sulfa Antibiotics Hives  . Amoxicillin Rash  . Penicillins Rash    Current Outpatient Prescriptions  Medication Sig Dispense Refill  . albuterol (PROVENTIL) (2.5 MG/3ML) 0.083% nebulizer solution 2.5 mg.      . albuterol (VENTOLIN HFA) 108 (90 BASE) MCG/ACT inhaler Inhale 2 puffs into the lungs every 4 (four) hours as needed.        No current  facility-administered medications for this visit.    Review of Systems Review of Systems  Constitutional: Positive for fever and chills.  Respiratory: Negative.   Cardiovascular: Negative.     Blood pressure 124/76, pulse 76, temperature 98 F (36.7 C), temperature source Oral, resp. rate 16, height 4' 9"  (1.448 m), weight 136 lb (61.689 kg).  Physical Exam Physical Exam Right breast exam unchanged. No palpable mass.  Data Reviewed BRCA report  MRI breast showing small nodule directly under the nipple on the right.  Ultrasound repeated today showing no findings.   Assessment    High risk status with questionable right breast finding.     Plan    Will discuss with Radiologist and Oncologist regarding biopsy of the area of concern either with MRI guided or subareolar duct excision.        SANKAR,SEEPLAPUTHUR G 11/09/2013, 11:00 AM

## 2013-11-09 NOTE — Patient Instructions (Signed)
The patient is aware to call back for any questions or concerns.  

## 2013-11-10 ENCOUNTER — Telehealth: Payer: Self-pay | Admitting: *Deleted

## 2013-11-10 NOTE — Telephone Encounter (Signed)
Patient's surgery has been scheduled for 11-16-13 at [facility]. This patient is aware of all instructions. She verbalizes understanding.   Message left for Al Pimple, [facility], regarding this information.

## 2013-11-11 ENCOUNTER — Other Ambulatory Visit: Payer: Self-pay | Admitting: General Surgery

## 2013-11-11 DIAGNOSIS — N6452 Nipple discharge: Secondary | ICD-10-CM

## 2013-11-11 DIAGNOSIS — Z1501 Genetic susceptibility to malignant neoplasm of breast: Secondary | ICD-10-CM

## 2013-11-11 DIAGNOSIS — Z1509 Genetic susceptibility to other malignant neoplasm: Secondary | ICD-10-CM

## 2013-11-16 ENCOUNTER — Ambulatory Visit: Payer: Self-pay | Admitting: General Surgery

## 2013-11-16 ENCOUNTER — Encounter: Payer: Self-pay | Admitting: General Surgery

## 2013-11-16 DIAGNOSIS — N6459 Other signs and symptoms in breast: Secondary | ICD-10-CM

## 2013-11-16 DIAGNOSIS — Z1501 Genetic susceptibility to malignant neoplasm of breast: Secondary | ICD-10-CM

## 2013-11-16 HISTORY — PX: BREAST BIOPSY: SHX20

## 2013-11-16 LAB — DRUG SCREEN, URINE
AMPHETAMINES, UR SCREEN: NEGATIVE (ref ?–1000)
BARBITURATES, UR SCREEN: NEGATIVE (ref ?–200)
Benzodiazepine, Ur Scrn: POSITIVE (ref ?–200)
COCAINE METABOLITE, UR ~~LOC~~: NEGATIVE (ref ?–300)
Cannabinoid 50 Ng, Ur ~~LOC~~: POSITIVE (ref ?–50)
MDMA (Ecstasy)Ur Screen: NEGATIVE (ref ?–500)
METHADONE, UR SCREEN: NEGATIVE (ref ?–300)
Opiate, Ur Screen: NEGATIVE (ref ?–300)
Phencyclidine (PCP) Ur S: NEGATIVE (ref ?–25)
TRICYCLIC, UR SCREEN: NEGATIVE (ref ?–1000)

## 2013-11-17 ENCOUNTER — Encounter: Payer: Self-pay | Admitting: General Surgery

## 2013-11-17 LAB — PATHOLOGY REPORT

## 2013-11-18 ENCOUNTER — Ambulatory Visit (INDEPENDENT_AMBULATORY_CARE_PROVIDER_SITE_OTHER): Payer: Self-pay | Admitting: *Deleted

## 2013-11-18 ENCOUNTER — Telehealth: Payer: Self-pay

## 2013-11-18 VITALS — Temp 97.8°F

## 2013-11-18 DIAGNOSIS — N6452 Nipple discharge: Secondary | ICD-10-CM

## 2013-11-18 DIAGNOSIS — N6459 Other signs and symptoms in breast: Secondary | ICD-10-CM

## 2013-11-18 MED ORDER — DOXYCYCLINE HYCLATE 100 MG PO CAPS: 100.0000 mg | ORAL_CAPSULE | Freq: Two times a day (BID) | ORAL | Status: DC

## 2013-11-18 NOTE — Progress Notes (Signed)
She is post right subareolar duct excision on 11-16-13. She states she is having increased pain, fever, and chills since yesterday. The drainage is yellow with blood tinged. Temp today is 97.8. She is taking tylenol as well. The area is red and tender. Dr Jamal Collin present ans ordered antibiotic for 7 days, pt agrees. Aware of benign pathology.

## 2013-11-18 NOTE — Telephone Encounter (Signed)
Patient's friend Kennieth Rad called the answering service to let us know that the patient's incision site was swollen and was concerned about this and would like a call back.

## 2013-11-18 NOTE — Patient Instructions (Signed)
Follow up as scheduled.  

## 2013-11-18 NOTE — Telephone Encounter (Signed)
Tammy states the patient had a fever last night. States the incision is red and oozing. Appt for the nurse made.

## 2013-11-23 ENCOUNTER — Ambulatory Visit: Payer: PRIVATE HEALTH INSURANCE | Admitting: General Surgery

## 2013-11-23 ENCOUNTER — Ambulatory Visit (INDEPENDENT_AMBULATORY_CARE_PROVIDER_SITE_OTHER): Payer: Self-pay | Admitting: General Surgery

## 2013-11-23 ENCOUNTER — Encounter: Payer: Self-pay | Admitting: General Surgery

## 2013-11-23 ENCOUNTER — Inpatient Hospital Stay: Payer: Self-pay | Admitting: Internal Medicine

## 2013-11-23 VITALS — BP 100/70 | HR 80 | Temp 97.9°F | Resp 16 | Ht 61.0 in | Wt 134.0 lb

## 2013-11-23 DIAGNOSIS — Z9889 Other specified postprocedural states: Secondary | ICD-10-CM

## 2013-11-23 LAB — CBC
HCT: 38.2 % (ref 35.0–47.0)
HGB: 13.1 g/dL (ref 12.0–16.0)
MCH: 30.8 pg (ref 26.0–34.0)
MCHC: 34.2 g/dL (ref 32.0–36.0)
MCV: 90 fL (ref 80–100)
Platelet: 207 10*3/uL (ref 150–440)
RBC: 4.25 10*6/uL (ref 3.80–5.20)
RDW: 12.5 % (ref 11.5–14.5)
WBC: 5.2 10*3/uL (ref 3.6–11.0)

## 2013-11-23 LAB — BASIC METABOLIC PANEL
Anion Gap: 5 — ABNORMAL LOW (ref 7–16)
BUN: 8 mg/dL (ref 7–18)
Calcium, Total: 8.3 mg/dL — ABNORMAL LOW (ref 8.5–10.1)
Chloride: 104 mmol/L (ref 98–107)
Co2: 30 mmol/L (ref 21–32)
Creatinine: 0.75 mg/dL (ref 0.60–1.30)
EGFR (Non-African Amer.): >60
GLUCOSE: 96 mg/dL (ref 65–99)
OSMOLALITY: 276 (ref 275–301)
Potassium: 3.5 mmol/L (ref 3.5–5.1)
SODIUM: 139 mmol/L (ref 136–145)

## 2013-11-23 NOTE — Progress Notes (Signed)
She is post right subareolar duct excision on 11-16-13. She states she is having increased pain. The area opened up last night and she had drainage. Currently still on antibiotics.  Mild redness and tenderness at incision, no fluctuant noted. Incision is intact. Drainage at one end looks serosanguinous.   Culture sent. Follow up in one week.

## 2013-11-23 NOTE — Patient Instructions (Signed)
Warm compresses Continue antibiotic

## 2013-11-28 LAB — CULTURE, BLOOD (SINGLE)

## 2013-11-28 LAB — ANAEROBIC AND AEROBIC CULTURE

## 2013-11-30 ENCOUNTER — Ambulatory Visit: Payer: PRIVATE HEALTH INSURANCE | Admitting: General Surgery

## 2013-12-14 ENCOUNTER — Ambulatory Visit: Payer: PRIVATE HEALTH INSURANCE | Admitting: General Surgery

## 2013-12-14 ENCOUNTER — Emergency Department: Payer: Self-pay | Admitting: Emergency Medicine

## 2013-12-14 LAB — COMPREHENSIVE METABOLIC PANEL
ALBUMIN: 3.9 g/dL (ref 3.4–5.0)
Alkaline Phosphatase: 121 U/L — ABNORMAL HIGH
Anion Gap: 4 — ABNORMAL LOW (ref 7–16)
BUN: 10 mg/dL (ref 7–18)
Bilirubin,Total: 0.2 mg/dL (ref 0.2–1.0)
Calcium, Total: 8.5 mg/dL (ref 8.5–10.1)
Chloride: 106 mmol/L (ref 98–107)
Co2: 30 mmol/L (ref 21–32)
Creatinine: 0.75 mg/dL (ref 0.60–1.30)
EGFR (African American): >60
EGFR (Non-African Amer.): >60
Glucose: 84 mg/dL (ref 65–99)
Osmolality: 278 (ref 275–301)
POTASSIUM: 3.5 mmol/L (ref 3.5–5.1)
SGOT(AST): 18 U/L (ref 15–37)
SGPT (ALT): 23 U/L
SODIUM: 140 mmol/L (ref 136–145)
TOTAL PROTEIN: 7.2 g/dL (ref 6.4–8.2)

## 2013-12-14 LAB — CBC WITH DIFFERENTIAL/PLATELET
BASOS PCT: 0.5 %
Basophil #: 0 10*3/uL (ref 0.0–0.1)
Eosinophil #: 0.1 10*3/uL (ref 0.0–0.7)
Eosinophil %: 0.9 %
HCT: 43.3 % (ref 35.0–47.0)
HGB: 14.3 g/dL (ref 12.0–16.0)
Lymphocyte #: 2.5 10*3/uL (ref 1.0–3.6)
Lymphocyte %: 37.2 %
MCH: 29.7 pg (ref 26.0–34.0)
MCHC: 33.1 g/dL (ref 32.0–36.0)
MCV: 90 fL (ref 80–100)
Monocyte #: 0.4 x10 3/mm (ref 0.2–0.9)
Monocyte %: 5.4 %
Neutrophil #: 3.8 10*3/uL (ref 1.4–6.5)
Neutrophil %: 56 %
Platelet: 179 10*3/uL (ref 150–440)
RBC: 4.81 10*6/uL (ref 3.80–5.20)
RDW: 13.2 % (ref 11.5–14.5)
WBC: 6.8 10*3/uL (ref 3.6–11.0)

## 2013-12-16 ENCOUNTER — Ambulatory Visit (INDEPENDENT_AMBULATORY_CARE_PROVIDER_SITE_OTHER): Payer: PRIVATE HEALTH INSURANCE | Admitting: General Surgery

## 2013-12-16 ENCOUNTER — Other Ambulatory Visit: Payer: PRIVATE HEALTH INSURANCE

## 2013-12-16 ENCOUNTER — Encounter: Payer: Self-pay | Admitting: General Surgery

## 2013-12-16 VITALS — BP 94/64 | HR 90 | Resp 14 | Ht 60.0 in | Wt 132.0 lb

## 2013-12-16 DIAGNOSIS — Z9889 Other specified postprocedural states: Secondary | ICD-10-CM

## 2013-12-16 DIAGNOSIS — R197 Diarrhea, unspecified: Secondary | ICD-10-CM

## 2013-12-16 NOTE — Progress Notes (Signed)
Here today for follow up right breast lumpectomy 11-16-13. She has since then been to the ER x 2 for breast pain, vomiting and infection. She is currently on clindamycin and complains of diarrhea. She says the area is still tender and like " a golf ball". She is BRCA positive. The right breast shows n]very small firmness near nipple. No redness or skin induration noted. Pt had US showing a biopsy cavity about 2.5 cm 3 days ago. [place] was repeated today and the biopsy cavity is smaller at 1.5 cm. Diarrhea needs to be evaluated since she has been on cleocin. Patient sent to lab to obtain stool for C Diff.

## 2013-12-16 NOTE — Patient Instructions (Signed)
Continue self breast exams. Call office for any new breast issues or concerns. 

## 2013-12-18 ENCOUNTER — Encounter: Payer: Self-pay | Admitting: General Surgery

## 2013-12-18 ENCOUNTER — Emergency Department: Payer: Self-pay | Admitting: Student

## 2013-12-18 LAB — COMPREHENSIVE METABOLIC PANEL
ALK PHOS: 117 U/L — AB
Albumin: 3.7 g/dL (ref 3.4–5.0)
Anion Gap: 9 (ref 7–16)
BILIRUBIN TOTAL: 0.2 mg/dL (ref 0.2–1.0)
BUN: 10 mg/dL (ref 7–18)
CALCIUM: 8.4 mg/dL — AB (ref 8.5–10.1)
CREATININE: 0.92 mg/dL (ref 0.60–1.30)
Chloride: 106 mmol/L (ref 98–107)
Co2: 27 mmol/L (ref 21–32)
EGFR (African American): >60
GLUCOSE: 87 mg/dL (ref 65–99)
OSMOLALITY: 282 (ref 275–301)
POTASSIUM: 3.8 mmol/L (ref 3.5–5.1)
SGOT(AST): 25 U/L (ref 15–37)
SGPT (ALT): 22 U/L
Sodium: 142 mmol/L (ref 136–145)
Total Protein: 7.2 g/dL (ref 6.4–8.2)

## 2013-12-18 LAB — LIPASE, BLOOD: LIPASE: 95 U/L (ref 73–393)

## 2013-12-18 LAB — CBC WITH DIFFERENTIAL/PLATELET
BASOS PCT: 1 %
Basophil #: 0.1 10*3/uL (ref 0.0–0.1)
Eosinophil #: 0.1 10*3/uL (ref 0.0–0.7)
Eosinophil %: 1.8 %
HCT: 44.3 % (ref 35.0–47.0)
HGB: 14.7 g/dL (ref 12.0–16.0)
Lymphocyte #: 2.7 10*3/uL (ref 1.0–3.6)
Lymphocyte %: 46.5 %
MCH: 30 pg (ref 26.0–34.0)
MCHC: 33.2 g/dL (ref 32.0–36.0)
MCV: 90 fL (ref 80–100)
Monocyte #: 0.4 x10 3/mm (ref 0.2–0.9)
Monocyte %: 6.4 %
Neutrophil #: 2.5 10*3/uL (ref 1.4–6.5)
Neutrophil %: 44.3 %
Platelet: 165 10*3/uL (ref 150–440)
RBC: 4.91 10*6/uL (ref 3.80–5.20)
RDW: 13.5 % (ref 11.5–14.5)
WBC: 5.7 10*3/uL (ref 3.6–11.0)

## 2013-12-18 LAB — TROPONIN I: Troponin-I: <0.02 ng/mL

## 2013-12-23 LAB — CULTURE, BLOOD (SINGLE)

## 2013-12-27 ENCOUNTER — Emergency Department: Payer: Self-pay | Admitting: Emergency Medicine

## 2013-12-30 ENCOUNTER — Ambulatory Visit: Payer: PRIVATE HEALTH INSURANCE | Admitting: General Surgery

## 2014-01-03 ENCOUNTER — Encounter: Payer: Self-pay | Admitting: General Surgery

## 2014-04-10 ENCOUNTER — Emergency Department: Payer: Self-pay | Admitting: Emergency Medicine

## 2014-06-09 HISTORY — PX: TOOTH EXTRACTION: SUR596

## 2014-06-12 ENCOUNTER — Encounter (HOSPITAL_COMMUNITY): Payer: Self-pay | Admitting: *Deleted

## 2014-06-12 ENCOUNTER — Emergency Department (HOSPITAL_COMMUNITY)
Admission: EM | Admit: 2014-06-12 | Discharge: 2014-06-12 | Disposition: A | Discharge status: Home / Self Care | Payer: Medicaid Other | Attending: Emergency Medicine | Admitting: Emergency Medicine

## 2014-06-12 DIAGNOSIS — Z79899 Other long term (current) drug therapy: Secondary | ICD-10-CM | POA: Diagnosis not present

## 2014-06-12 DIAGNOSIS — R112 Nausea with vomiting, unspecified: Secondary | ICD-10-CM | POA: Diagnosis not present

## 2014-06-12 DIAGNOSIS — Z720 Tobacco use: Secondary | ICD-10-CM | POA: Diagnosis not present

## 2014-06-12 DIAGNOSIS — Z8541 Personal history of malignant neoplasm of cervix uteri: Secondary | ICD-10-CM | POA: Insufficient documentation

## 2014-06-12 DIAGNOSIS — F319 Bipolar disorder, unspecified: Secondary | ICD-10-CM | POA: Insufficient documentation

## 2014-06-12 DIAGNOSIS — J449 Chronic obstructive pulmonary disease, unspecified: Secondary | ICD-10-CM | POA: Insufficient documentation

## 2014-06-12 DIAGNOSIS — Z8742 Personal history of other diseases of the female genital tract: Secondary | ICD-10-CM | POA: Diagnosis not present

## 2014-06-12 DIAGNOSIS — Z85840 Personal history of malignant neoplasm of eye: Secondary | ICD-10-CM | POA: Insufficient documentation

## 2014-06-12 DIAGNOSIS — Z792 Long term (current) use of antibiotics: Secondary | ICD-10-CM | POA: Insufficient documentation

## 2014-06-12 DIAGNOSIS — Z8739 Personal history of other diseases of the musculoskeletal system and connective tissue: Secondary | ICD-10-CM | POA: Diagnosis not present

## 2014-06-12 DIAGNOSIS — K088 Other specified disorders of teeth and supporting structures: Secondary | ICD-10-CM | POA: Diagnosis present

## 2014-06-12 DIAGNOSIS — K0889 Other specified disorders of teeth and supporting structures: Secondary | ICD-10-CM

## 2014-06-12 MED ORDER — PROMETHAZINE HCL 25 MG/ML IJ SOLN
25.0000 mg | Freq: Once | INTRAMUSCULAR | Status: AC
  Administered 2014-06-12: 25 mg via INTRAMUSCULAR
  Filled 2014-06-12: qty 1

## 2014-06-12 MED ORDER — MORPHINE SULFATE 4 MG/ML IJ SOLN
6.0000 mg | Freq: Once | INTRAMUSCULAR | Status: AC
Start: 2014-06-12 — End: 2014-06-12
  Administered 2014-06-12: 6 mg via INTRAMUSCULAR
  Filled 2014-06-12: qty 2

## 2014-06-12 MED ORDER — TRAMADOL HCL 50 MG PO TABS: 50.0000 mg | ORAL_TABLET | Freq: Four times a day (QID) | ORAL | Status: DC | PRN

## 2014-06-12 MED ORDER — PROMETHAZINE HCL 25 MG PO TABS
25.0000 mg | ORAL_TABLET | Freq: Four times a day (QID) | ORAL | Status: DC | PRN
Start: 2014-06-12 — End: 2014-06-14

## 2014-06-12 NOTE — ED Notes (Addendum)
Pt reports she was on an abx for 3 days,then dentist pulled 7 teeth on 4/7. Pt reports mouth pain 7/10. Vomited x2 today. Reports chills.

## 2014-06-12 NOTE — Discharge Instructions (Signed)

## 2014-06-12 NOTE — ED Provider Notes (Signed)
CSN: [id number]     Arrival date & time 06/12/14  1212 History   First MD Initiated Contact with Patient 06/12/14 1220     Chief Complaint  Patient presents with  . Dental Pain  . Emesis     (Consider location/radiation/quality/duration/timing/severity/associated sxs/prior Treatment) HPI Comments: Patient presents with dental pain. She states that 3 days ago she had 7 teeth pulled by an oral Psychologist, sport and exercise. She states that she is currently on clindamycin. She feels like she's had more swelling around her teeth. She reports increased pain. She was on oxycodone but she ran out of oxycodone. She denies any fevers. She's had 2 episodes of vomiting today related to the pain. She denies any drainage from the teeth. She's been using salt water gargles as well as the clindamycin with no improvement of symptoms.  Patient is a 49 y.o. female presenting with tooth pain and vomiting.  Dental Pain Associated symptoms: no congestion, no fever and no headaches   Emesis Associated symptoms: no abdominal pain, no arthralgias, no chills, no diarrhea and no headaches     Past Medical History  Diagnosis Date  . Herniated disc   . Asthma   . Arthritis   . Urethral diverticulum   . Cancer     cervical cancer s/p hysterectomy  . Cervical cancer   . Atrophic vaginitis   . Seizures   . Bipolar disorder   . COPD (chronic obstructive pulmonary disease)   . Emphysema/COPD   . 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 Cesarean section    . Abdominal hysterectomy      cervical cancer age 52  . Total abdominal hysterectomy w/ bilateral salpingoophorectomy    . Vaginal prolapse repair  2012  . Breast biopsy Right 11-16-13    BRCA positive   Family History  Problem Relation Age of Onset  . Cancer Mother     ovarian, breast  . Heart disease Mother   . Cancer Father   . Cancer Sister     brain  . Breast cancer Sister   . Breast cancer Maternal Aunt    History   Substance Use Topics  . Smoking status: Current Every Day Smoker -- 1.00 packs/day  . Smokeless tobacco: Never Used  . Alcohol Use: No   OB History    Gravida Para Term Preterm AB TAB SAB Ectopic Multiple Living   _0 Obstetric Comments   1st Menstrual Cycle:  11 1st Pregnancy:  17     Review of Systems  Constitutional: Negative for fever, chills, diaphoresis and fatigue.  HENT: Positive for dental problem. Negative for congestion, rhinorrhea and sneezing.   Eyes: Negative.   Respiratory: Negative for cough, chest tightness and shortness of breath.   Cardiovascular: Negative for chest pain and leg swelling.  Gastrointestinal: Positive for nausea and vomiting. Negative for abdominal pain, diarrhea and blood in stool.  Genitourinary: Negative for frequency, hematuria, flank pain and difficulty urinating.  Musculoskeletal: Negative for back pain and arthralgias.  Skin: Negative for rash.  Neurological: Negative for dizziness, speech difficulty, weakness, numbness and headaches.      Allergies  Codeine; Nsaids; Sulfa antibiotics; Amoxicillin; and Penicillins  Home Medications   Prior to Admission medications   Medication Sig Start Date End Date Taking? Authorizing Provider  albuterol (PROVENTIL) (2.5 MG/3ML) 0.083% nebulizer solution 2.5 mg. 11/26/12 11/26/13  Historical Provider, MD  albuterol (VENTOLIN HFA) 108 (90 BASE) MCG/ACT inhaler Inhale 2 puffs into the lungs every 4 (four) hours as needed.     Historical Provider, MD  clindamycin (CLEOCIN) 300 MG capsule Take 300 mg by mouth 3 (three) times daily.    Historical Provider, MD  ondansetron (ZOFRAN-ODT) 4 MG disintegrating tablet Take 4 mg by mouth every 8 (eight) hours as needed for nausea or vomiting.    Historical Provider, MD  oxycodone-acetaminophen (ROXICET) 5-500 MG per tablet Take 1 tablet by mouth every 4 (four) hours as needed for pain.    Historical Provider, MD  promethazine (PHENERGAN) 12.5 MG  tablet Take 1 tablet (12.5 mg total) by mouth every 6 (six) hours as needed for nausea. 09/25/10 10/02/10  Clovis Cao, MD  promethazine (PHENERGAN) 25 MG tablet Take 1 tablet (25 mg total) by mouth every 6 (six) hours as needed for nausea or vomiting. 06/12/14   Malvin Johns, MD  Tiotropium Bromide Monohydrate (SPIRIVA HANDIHALER IN) Inhale into the lungs.    Historical Provider, MD  traMADol (ULTRAM) 50 MG tablet Take 1 tablet (50 mg total) by mouth every 6 (six) hours as needed. 06/12/14   Malvin Johns, MD   BP 136/73 mmHg  Pulse 77  Temp(Src) 98.4 F (36.9 C) (Oral)  Resp 16  SpO2 98% Physical Exam  Constitutional: She is oriented to person, place, and time. She appears well-developed and well-nourished.  HENT:  Head: Normocephalic and atraumatic.  Patient has some mild swelling along the lower gumline where teeth removed. There is some exudative material around the gums. There is no drainage. There is no significant swelling. There is no induration or fluctuance. No trismus. No significant facial swelling is noted.  Eyes: Pupils are equal, round, and reactive to light.  Neck: Normal range of motion. Neck supple.  Cardiovascular: Normal rate, regular rhythm and normal heart sounds.   Pulmonary/Chest: Effort normal and breath sounds normal. No respiratory distress. She has no wheezes. She has no rales. She exhibits no tenderness.  Abdominal: Soft. Bowel sounds are normal. There is no tenderness. There is no rebound and no guarding.  Musculoskeletal: Normal range of motion. She exhibits no edema.  Lymphadenopathy:    She has no cervical adenopathy.  Neurological: She is alert and oriented to person, place, and time.  Skin: Skin is warm and dry. No rash noted.  Psychiatric: She has a normal mood and affect.    ED Course  Procedures (including critical care time) Labs Review Labs Reviewed - No data to display  Imaging Review No results found.   EKG Interpretation None       MDM   Final diagnoses:  Pain, dental    Patient presents with postsurgical dental pain. I don't see any evidence of infection. She will continue the clindamycin and salt water gargles as instructed by her oral surgeon. She was given a shot of morphine and Phenergan in the ED. I gave her prescription for Ultram and Phenergan to use at home for symptomatic relief. She was encouraged to follow-up with her oral surgeon on Monday or Tuesday if her symptoms are not improved.    Malvin Johns, MD 06/12/14 [phone]

## 2014-06-14 ENCOUNTER — Encounter: Payer: Self-pay | Admitting: General Surgery

## 2014-06-14 ENCOUNTER — Ambulatory Visit (INDEPENDENT_AMBULATORY_CARE_PROVIDER_SITE_OTHER): Payer: Medicaid Other | Admitting: General Surgery

## 2014-06-14 DIAGNOSIS — Z1509 Genetic susceptibility to other malignant neoplasm: Principal | ICD-10-CM

## 2014-06-14 DIAGNOSIS — Z1501 Genetic susceptibility to malignant neoplasm of breast: Secondary | ICD-10-CM

## 2014-06-14 NOTE — Patient Instructions (Addendum)
The patient is aware to call back for any questions or concerns.  Mastectomy, With or Without Reconstruction Mastectomy (removal of the breast) is a procedure most commonly used to treat cancer (tumor) of the breast. Different procedures are available for treatment. This depends on the stage of the tumor (abnormal growths). Discuss this with your caregiver, surgeon (a specialist for performing operations such as this), or oncologist (someone specialized in the treatment of cancer). With proper information, you can decide which treatment is best for you. Although the sound of the word cancer is frightening to all of [place], the new treatments and medications can be a source of reassurance and comfort. If there are things you are worried about, discuss them with your caregiver. He or she can help comfort you and your family. Some of the different procedures for treating breast cancer are:  Radical (extensive) mastectomy. This is an operation used to remove the entire breast, the muscles under the breast, and all of the glands (lymph nodes) under the arm. With all of the new treatments available for cancer of the breast, this procedure has become less common.  Modified radical mastectomy. This is a similar operation to the radical mastectomy described above. In the modified radical mastectomy, the muscles of the chest wall are not removed unless one of the lessor muscles is removed. One of the lessor muscles may be removed to allow better removal of the lymph nodes. The axillary lymph nodes are also removed. Rarely, during an axillary node dissection nerves to this area are damaged. Radiation therapy is then often used to the area following this surgery.  A total mastectomy also known as a complete or simple mastectomy. It involves removal of only the breast. The lymph nodes and the muscles are left in place.  In a lumpectomy, the lump is removed from the breast. This is the simplest form of surgical treatment.  A sentinel lymph node biopsy may also be done. Additional treatment may be required. RISKS AND COMPLICATIONS The main problems that follow removal of the breast include:  Infection (germs start growing in the wound). This can usually be treated with antibiotics (medications that kill germs).  Lymphedema. This means the arm on the side of the breast that was operated on swells because the lymph (tissue fluid) cannot follow the main channels back into the body. This only occurs when the lymph nodes have had to be removed under the arm.  There may be some areas of numbness to the upper arm and around the incision (cut by the surgeon) in the breast. This happens because of the cutting of or damage to some of the nerves in the area. This is most often unavoidable.  There may be difficulty moving the arm in a full range of motion (moving in all directions) following surgery. This usually improves with time following use and exercise.  Recurrence of breast cancer may happen with the very best of surgery and follow up treatment. Sometimes small cancer cells that cannot be seen with the naked eye have already spread at the time of surgery. When this happens other treatment is available. This treatment may be radiation, medications or a combination of both. RECONSTRUCTION Reconstruction of the breast may be done immediately if there is not going to be post-operative radiation. This surgery is done for cosmetic (improve appearance) purposes to improve the physical appearance after the operation. This may be done in two ways:  It can be done using a saline filled prosthetic (an  artificial breast which is filled with salt water). Silicone breast implants are now re-approved by the FDA and are being commonly used.  Reconstruction can be done using the body's own muscle/fat/skin. Your caregiver will discuss your options with you. Depending upon your needs or choice, together you will be able to determine which  procedure is best for you. Document Released: 11/13/2000 Document Revised: 11/13/2011 Document Reviewed: 07/07/2007 [facility] Patient Information 2015 [place], [place]. This information is not intended to replace advice given to you by your health care provider. Make sure you discuss any questions you have with your health care provider.

## 2014-06-14 NOTE — Progress Notes (Signed)
Patient ID: Shelby Wang, female   DOB: 08/09/1965, 49 y.o.   MRN: [id number]  Chief Complaint  Patient presents with  . Follow-up    discuss surgery    HPI Shelby Wang is a 49 y.o. female.  Here today for follow up BRCA positive and discuss prophylactic bilateral mastectomy since she is BRCA positive. She does notice some green nipple discharge occasionally from the right breast. She has already had bilateral oophorectomy in the past. She has one son  that is BRCA positive and one daughter that is BRCA negative. She is here today with her daughter.   HPI  Past Medical History  Diagnosis Date  . Herniated disc   . Asthma   . Arthritis   . Urethral diverticulum   . Cancer     cervical cancer s/p hysterectomy  . Cervical cancer   . Atrophic vaginitis   . Seizures   . Bipolar disorder   . COPD (chronic obstructive pulmonary disease)   . Emphysema/COPD   . 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 Cesarean section    . Total abdominal hysterectomy w/ bilateral salpingoophorectomy    . Vaginal prolapse repair  2012  . Tooth extraction  06-09-14  . Abdominal hysterectomy      cervical cancer age 83  . Breast biopsy Right 11-16-13    BRCA II positive    Family History  Problem Relation Age of Onset  . Cancer Mother     ovarian, breast  . Heart disease Mother   . Cancer Father   . Cancer Sister     brain  . Breast cancer Sister   . Breast cancer Maternal Aunt   . BRCA 1/2 Son 65    positive  . BRCA 1/2 Daughter 26    negative    Social History History  Substance Use Topics  . Smoking status: Current Every Day Smoker -- 5.00 packs/day    Types: Cigarettes  . Smokeless tobacco: Never Used  . Alcohol Use: No    Allergies  Allergen Reactions  . Codeine Hives  . Nsaids Other (See Comments)    Upset stomach  . Sulfa Antibiotics Hives  . Amoxicillin Rash  . Penicillins Rash    Current Outpatient Prescriptions   Medication Sig Dispense Refill  . albuterol (VENTOLIN HFA) 108 (90 BASE) MCG/ACT inhaler Inhale 2 puffs into the lungs every 4 (four) hours as needed for wheezing.     . clindamycin (CLEOCIN) 150 MG capsule Take by mouth 3 (three) times daily.    . clonazePAM (KLONOPIN) 0.5 MG tablet Take 0.5 mg by mouth 3 (three) times daily as needed for anxiety.    Marland Kitchen ibuprofen (ADVIL,MOTRIN) 200 MG tablet Take 200 mg by mouth every 6 (six) hours as needed for mild pain.    Marland Kitchen nystatin (MYCOSTATIN) 100000 UNIT/ML suspension Take 5 mLs by mouth 4 (four) times daily.    . sertraline (ZOLOFT) 100 MG tablet Take 100 mg by mouth daily.    . Tiotropium Bromide Monohydrate (SPIRIVA HANDIHALER IN) Inhale 1 capsule into the lungs daily.     . traZODone (DESYREL) 100 MG tablet Take 100 mg by mouth at bedtime.    Marland Kitchen albuterol (PROVENTIL) (2.5 MG/3ML) 0.083% nebulizer solution 2.5 mg.     No current facility-administered medications for this visit.    Review of Systems Review of Systems  Constitutional: Negative.   Respiratory: Positive  for cough.   Cardiovascular: Negative.     Blood pressure 116/60, pulse 66, resp. rate 12, height 4' 11"  (1.499 m), weight 132 lb (59.875 kg).  Physical Exam Physical Exam  Constitutional: She is oriented to person, place, and time. She appears well-developed and well-nourished.  Eyes: Conjunctivae are normal. No scleral icterus.  Neck: Neck supple.  Cardiovascular: Normal rate, regular rhythm and normal heart sounds.   Pulmonary/Chest: Effort normal. She has wheezes. Right breast exhibits no inverted nipple, no mass, no nipple discharge, no skin change and no tenderness. Left breast exhibits no inverted nipple, no mass, no nipple discharge, no skin change and no tenderness.  scattered wheezes.  Abdominal: Soft. There is no hepatomegaly. There is no tenderness.  Lymphadenopathy:    She has no cervical adenopathy.    She has no axillary adenopathy.  Neurological: She is alert  and oriented to person, place, and time.  Skin: Skin is warm and dry.    Data Reviewed none  Assessment    Stable physical exam.BRCA carrirer    Plan    Discussed risk and benefits of bilateral prophylactic mastectomy. Pt is not interested in reconstruction at present. Procedure explained to pt. She is agreeable.       PCP: Reece Leader RN     Junie Panning G 06/14/2014, 11:49 AM

## 2014-06-14 NOTE — Progress Notes (Signed)
Patient ID: Shelby Wang, female   DOB: [birth year], 49 y.o.   MRN: [id number] Patient is scheduled for surgery at [facility] on 06/23/14. She will pre admit by phone. Patient is aware of date, and all instructions.

## 2014-06-21 ENCOUNTER — Ambulatory Visit
Admit: 2014-06-21 | Disposition: A | Discharge status: Home / Self Care | Payer: Self-pay | Attending: Anesthesiology | Admitting: Anesthesiology

## 2014-06-21 LAB — CBC WITH DIFFERENTIAL/PLATELET
BASOS PCT: 0.4 %
Basophil #: 0 10*3/uL (ref 0.0–0.1)
Eosinophil #: 0 10*3/uL (ref 0.0–0.7)
Eosinophil %: 0.2 %
HCT: 44.9 % (ref 35.0–47.0)
HGB: 14.9 g/dL (ref 12.0–16.0)
LYMPHS PCT: 19.5 %
Lymphocyte #: 1.6 10*3/uL (ref 1.0–3.6)
MCH: 30.3 pg (ref 26.0–34.0)
MCHC: 33.3 g/dL (ref 32.0–36.0)
MCV: 91 fL (ref 80–100)
Monocyte #: 0.4 x10 3/mm (ref 0.2–0.9)
Monocyte %: 4.6 %
NEUTROS PCT: 75.3 %
Neutrophil #: 6.2 10*3/uL (ref 1.4–6.5)
Platelet: 276 10*3/uL (ref 150–440)
RBC: 4.94 10*6/uL (ref 3.80–5.20)
RDW: 13.7 % (ref 11.5–14.5)
WBC: 8.3 10*3/uL (ref 3.6–11.0)

## 2014-06-21 LAB — BASIC METABOLIC PANEL
Anion Gap: 5 — ABNORMAL LOW (ref 7–16)
BUN: 10 mg/dL
CO2: 28 mmol/L
CREATININE: 0.86 mg/dL
Calcium, Total: 9.5 mg/dL
Chloride: 104 mmol/L
EGFR (African American): >60
Glucose: 115 mg/dL — ABNORMAL HIGH
Potassium: 4.3 mmol/L
Sodium: 137 mmol/L

## 2014-06-22 ENCOUNTER — Encounter: Payer: Self-pay | Admitting: General Surgery

## 2014-06-23 ENCOUNTER — Encounter (INDEPENDENT_AMBULATORY_CARE_PROVIDER_SITE_OTHER): Payer: Medicaid Other | Admitting: General Surgery

## 2014-06-23 ENCOUNTER — Ambulatory Visit
Admit: 2014-06-23 | Disposition: A | Discharge status: Home / Self Care | Payer: Self-pay | Attending: General Surgery | Admitting: General Surgery

## 2014-06-23 DIAGNOSIS — Z1501 Genetic susceptibility to malignant neoplasm of breast: Secondary | ICD-10-CM

## 2014-06-23 HISTORY — PX: MASTECTOMY: SHX3

## 2014-06-23 LAB — DRUG SCREEN, URINE
AMPHETAMINES, UR SCREEN: NEGATIVE
BENZODIAZEPINE, UR SCRN: NEGATIVE
Barbiturates, Ur Screen: NEGATIVE
CANNABINOID 50 NG, UR ~~LOC~~: POSITIVE
Cocaine Metabolite,Ur ~~LOC~~: NEGATIVE
MDMA (Ecstasy)Ur Screen: NEGATIVE
METHADONE, UR SCREEN: NEGATIVE
Opiate, Ur Screen: NEGATIVE
Phencyclidine (PCP) Ur S: NEGATIVE
Tricyclic, Ur Screen: NEGATIVE

## 2014-06-24 ENCOUNTER — Encounter: Payer: Self-pay | Admitting: General Surgery

## 2014-06-24 NOTE — Consult Note (Signed)
Chief Complaint:   Chief Complaint Need a Psych Evaluation.   Presenting Symptoms:   Presenting Symptoms Anxiety/Panic  Substance Use   History of Present Illness:   History of Present Illness Pt is a 49 yo widowed WF who was referred by [facility] as she tested positive for Benzo while getting Suboxone treatment. She reported that she has been following with [facility] for the past 3 months and has been complaint with her treatment plan and is usually prescribed 8 mg TID. She gets drug tested often. Last week she tested positive for benzo as she used 2 pills of "blue Xanax" due to stress at home. She stated that she was late for her group and was also Dx with Bipolar DX and not getting medication at the time. However, now she is prescribed Zoloft by Dr Lyda Perone. She stated she is complaint with her treatment.  Pt stated that she started the Suboxone as she had h/o bladder prolapse and was given pain meds in the past. However, she tried to stop them on her own which was unsuccessful. She then started buying them off the streets including Oxycodone, Oxycontin, and became addicted to them. She stated that wants to do the legal way and after the death of her husband in 05-29-22, she decided to seek treatment. She initially bought Suboxone off the streets and then started the [facility] 3 months ago. She denied having mood swings, paranoia, agitation at this time. She denied SI/HI or plans.   Target Symptoms:   Manic Impulsivity    Behavior Impulsivity    Capacity Recognizes the presence of illlness  Understands consequences of treatment refusal  Understands risks, benefits, alternatives  Exhibits acceptable reasoning/judgment  Communicates clear choices  Can care for self independently   Past Psychiatric Treatment: History of Suicide Attempts: Denies.   Current Outpatient Treatment: Currently seeking treatment at [facility] for Suboxone.   Substance Abuse Treatment History: Gets Suboxone 8mg  po TID.    Current Psychotropic Medications: Zoloft 50mg  po qdaily.  Substance Abuse- Alcohol: The patient denies any use of alcohol..  Substance Abuse- Cocaine: The patient does report a history of IV cocaine use..  Substance Abuse- Opiates: The patient currently abuses opiates.. H/o Opiate abuse. Currently in treatment.  Substance Abuse- Cannabis: The patient denies any cannabis use.Marland Kitchen  PAST MEDICAL & SURGICAL HX:  Significant Events:   Drug Abuse:    agrophobia:    MRSA: 1.5 yrs ago on lower back   Bipolar Disorder:    Anxiety:    Asthma:    Hysterectomy - Total:   CURRENT OUTPATIENT MEDICATIONS:  Home Medications: Medication Instructions Status  ProAir HFA CFC free 90 mcg/inh aerosol 2 puffs inhaled every 4 hours as needed for wheezing or shortness of breath Active  predniSONE 20 mg tablet 2 tab(s) orally once a day, As Needed Active  Azithromycin 5 Day Dose Pack   As directed Active   Family History: The patient denies any history of mental illness in the family..  Social History: Pt is a widow. Her husband passed away in May 29, 2022 due to lung and brain Cancer. Lives with sister who has chronic back problems and she is her caretaker. She gets paid for the same.Her fiancee works for [employer]. Pt son is in [place] army. She gets food stamps.  History of Trauma or Abuse: The patient denies any history of previous physical or sexual abuse..  Mental Status Exam:   Mental Status Exam Pt is a moderately built female who appeared  her stated age.    Speech Fluent    Mood Good    Affect Congruent    Thought Processes Linear/logical/goal directed    Orientation Self  Place  Time  Person    Attention Alert  Awake  Oriented    Concentration Fair    Memory Intact    Fund of Knowledge Fair    Language Fair    Judgement Fair    Insight Fair    Reliabiity Fair   Suicide Risk Assessment: Suicide Risk Level No risk inidicated.  Review of Systems:  Review of  Systems:   Fever/Chills No    Cough Yes    Sputum No    Abdominal Pain No    Diarrhea No    Constipation No    Nausea/Vomiting No    SOB/DOE No    Chest Pain No    Dysuria No    Tolerating Diet Yes    Medications/Allergies Reviewed Medications/Allergies reviewed   NURSING FLOWSHEETS:  Vital Signs/Nurse Notes-CM: ED Vital Sign Flow Sheet:   16-Jan-14 07:31   Temp Temperature 96.5   Pulse Pulse 83   Respirations Respirations 18   SBP SBP 116   DBP DBP 73   Pulse Ox % Pulse Ox % 97   Pulse Ox Source Source Room Air   Pain Scale (0-10) Pain Scale (0-10) Scale:0   LAB:  Laboratory Results: Thyroid:    15-Jan-14 18:29, Thyroid Stimulating Hormone   Thyroid Stimulating Hormone 0.99   0.45-4.50  (International Unit)   -----------------------  Pregnant patients have   different reference   ranges for TSH:   - - - - - - - - - -   Pregnant, first trimetser:   0.36 - 2.50 uIU/mL  Hepatic:    15-Jan-14 18:29, Comprehensive Metabolic Panel   Bilirubin, Total 0.2   Alkaline Phosphatase 126   SGPT (ALT) 18   SGOT (AST) 22   Total Protein, Serum 7.4   Albumin, Serum 4.0  Routine Micro:    16-Jan-14 01:02, Influenza A + B Antigen ([facility])   Micro Text Report    INFLUENZA A+B ANTIGENS    COMMENT                   NEGATIVE FOR INFLUENZA A (ANTIGEN ABSENT)    COMMENT                   NEGATIVE FOR INFLUENZA B (ANTIGEN ABSENT)     ANTIBIOTIC   Comment 1..    NEGATIVE FOR INFLUENZA A (ANTIGEN ABSENT) A negative result does not exclude influenza.  Correlation with clinical impression is required.   Comment 2..    NEGATIVE FOR INFLUENZA B (ANTIGEN ABSENT)   Result(s) reported on 19 Mar 2012 at 01:49AM.  Routine Chem:    15-Jan-14 18:29, Comprehensive Metabolic Panel   Glucose, Serum 113   BUN 9   Creatinine (comp) 0.81   Sodium, Serum 140   Potassium, Serum 3.8   Chloride, Serum 107   CO2, Serum 25   Calcium (Total), Serum 8.6   Osmolality (calc) 279   eGFR  (African American) >60   eGFR (Non-African American) >60   eGFR values <61m/min/1.73 m2 may be an indication of chronic  kidney disease (CKD).  Calculated eGFR is useful in patients with stable renal function.  The eGFR calculation will not be reliable in acutely ill patients  when serum creatinine is changing rapidly. It is not useful in   patients  on dialysis. The eGFR calculation may not be applicable  to patients at the low and high extremes of body sizes, pregnant  women, and vegetarians.   Anion Gap 8    15-Jan-14 18:29, Ethanol, Serum   Ethanol, S. < 3   Ethanol % (comp) < 0.003   Result(s) reported on 18 Mar 2012 at 07:16PM.  Urine Drugs:    15-Jan-14 18:28, Urine Drug Screen, Qual   Tricyclic Antidepressant, Ur Qual (comp) NEGATIVE   Result(s) reported on 18 Mar 2012 at 06:46PM.   Amphetamines, Urine Qual. NEGATIVE   MDMA, Urine Qual. NEGATIVE   Cocaine Metabolite, Urine Qual. NEGATIVE   Opiate, Urine qual NEGATIVE   Phencyclidine, Urine Qual. NEGATIVE   Cannabinoid, Urine Qual. NEGATIVE   Barbiturates, Urine Qual. NEGATIVE   Benzodiazepine, Urine Qual. NEGATIVE   -----------------  The URINE DRUG SCREEN provides only a preliminary, unconfirmed  analytical test result and should not be used for non-medical   purposes.  Clinical consideration and professional judgment should be   applied to any positive drug screen result due to possible  interfering substances.  A more specific alternate chemical method  must be used in order to obtain a confirmed analytical result.  Gas  chromatography/mass spectrometry (GC/MS) is the preferred  confirmatory method.   Methadone, Urine Qual. NEGATIVE  Routine UA:    15-Jan-14 18:28, Urinalysis   Color (UA) Straw   Clarity (UA) Clear   Glucose (UA) Negative   Bilirubin (UA) Negative   Ketones (UA) Negative   Specific Gravity (UA) 1.008   Blood (UA) Negative   pH (UA) 5.0   Protein (UA) Negative   Nitrite (UA) Negative    Leukocyte Esterase (UA) Negative   Result(s) reported on 18 Mar 2012 at 07:00PM.   RBC (UA)    NONE SEEN   WBC (UA) 1 /HPF   Bacteria (UA) TRACE   Epithelial Cells (UA) <1 /HPF   Mucous (UA) PRESENT   Result(s) reported on 18 Mar 2012 at 07:00PM.  Routine Hem:    15-Jan-14 18:29, Hemogram, Platelet Count   WBC (CBC) 7.0   RBC (CBC) 4.68   Hemoglobin (CBC) 14.7   Hematocrit (CBC) 43.0   Platelet Count (CBC) 184   Result(s) reported on 18 Mar 2012 at 06:52PM.   MCV 92   MCH 31.5   MCHC 34.2   RDW 12.5   Assessment & Diagnosis: Axis I: Bipolar Do NOS Opioid Dependence.   Axis II: Deferred.   Axis III: See PMH.   Axis IV: Problems with primary support group  Problems related to social environment .   Axis V: GAF 55.  Treatment Plan: Patient is aware of and understands the risks and benefits of the proposed treatment or treatment changes..   Patient understands the risks and benefits of the alternative treatments..   Discussed with pt about the treatment plan, risks, benefits and alternatives. She agreed to return to [facility] and is not experiencing any w/d symptoms at this time. She has been following her oupt psychiatrist on a regular basis and does not any medications. She is will be discharged in a stable condition. She agreed with the plan. [facility]  to help with discharge planning.   Thank you allowing me to participate in care of this pt.  Electronic Signatures: Jeronimo Norma (MD)  (Signed 16-Jan-14 10:15)  Authored: Chief Complaint, Presenting Symptoms, History of Present Illness, Target Symptoms, Past Psychiatric Treatment, Substance Abuse History, PAST MEDICAL & SURGICAL HX, CURRENT  OUTPATIENT MEDICATIONS, Family History, Social History, History of Trauma or Abuse, Mental Status Exam, Suicide Risk Assessment, Review of Systems, NURSING FLOWSHEETS, LAB, Assessment & Diagnosis, Treatment Plan   Last Updated: 16-Jan-14 10:15 by Jeronimo Norma (MD)

## 2014-06-25 ENCOUNTER — Emergency Department
Admit: 2014-06-25 | Disposition: A | Discharge status: Home / Self Care | Payer: Self-pay | Admitting: Emergency Medicine

## 2014-06-25 NOTE — Discharge Summary (Signed)
PATIENT NAME:  Shelby Wang, Shelby Wang MR#:  [id number] DATE OF BIRTH:  [birth year]  DATE OF ADMISSION:  11/23/2013 DATE OF DISCHARGE:  11/25/2013  PRIMARY CARE PHYSICIAN: Will be at the [facility].   FINAL DIAGNOSES:  1. Postoperative mastitis cellulitis.  2. Chronic obstructive pulmonary disease, wheeze.  3. Tobacco abuse.   MEDICATIONS ON DISCHARGE: Include Percocet 10/325 one tablet every 4 hours as needed for moderate pain, Spiriva 1 inhalation daily, nicotine inhalation device 1 inhaled every 2 hours as needed for nicotine craving, clindamycin 300 mg every 8 hours for 10 days, Flovent 220 one puff twice a day, albuterol CFC 1 puff 4 times a day as needed for shortness of breath.   DIET: Regular diet, regular consistency.   FOLLOW-UP: At the [facility] in 1 to 2 weeks.   LABORATORY AND RADIOLOGICAL DATA DURING THE HOSPITAL COURSE: Included a glucose of 96, BUN 8, creatinine 0.75, sodium 139, potassium 3.5, chloride 104, CO2 of 30. White blood cell count 5.2, hemoglobin and hematocrit 13.1 and 38.2, platelet count of 207,000. Blood cultures negative.   The patient was admitted, 11/23/2013, discharged 11/26/1998.   HOSPITAL COURSE PER PROBLEM LIST:  1. For the patient's postoperative mastitis cellulitis, the patient had an operative procedure by Dr. Jamal Collin on 11/16/2013, developed an infection afterwards and was started on oral doxycycline. The patient had a lot of drainage and severe pain, and ended up being admitted to the hospital. The patient was started on IV vancomycin. Dr. Jamal Collin did a culture in the office prior to admission. Dr. Jamal Collin saw in consultation and did not believe it looked much different than in the office. The patient declined an aspiration by Dr. Jamal Collin. The patient felt better with less pain. No drainage on the 24th. Erythema started to settle down and stayed within the outlined lines. Will be sent home on clindamycin.  2. For the patient's COPD and wheeze,  smoking cessation counseling was done. I did start inhalers and nebulizer treatments. Lungs upon discharge were clear.  3. For the tobacco abuse, smoking cessation counseling was done by me, 3 minutes.   TIME SPENT ON DISCHARGE: 35 minutes.    ____________________________ Tana Conch. Leslye Peer, MD rjw:JT D: 11/25/2013 13:20:50 ET T: 11/25/2013 14:05:18 ET JOB#: [id number]  cc: Tana Conch. Leslye Peer, MD, <Dictator> [facility] MD ELECTRONICALLY SIGNED 12/19/2013 12:45

## 2014-06-25 NOTE — Op Note (Signed)
PATIENT NAME:  Shelby Wang, Shelby Wang MR#:  [id number] DATE OF BIRTH:  1965/08/12  DATE OF PROCEDURE:  11/16/2013  PREOPERATIVE DIAGNOSES:   1.  Right nipple discharge.  2.  BRCA carrier.   POSTOPERATIVE DIAGNOSES: 1.  Right nipple discharge.  2.  BRCA carrier.   OPERATION: Right subareolar duct excision.   SURGEON: Mckinley Jewel, MD  ANESTHESIA: General.   COMPLICATIONS: None.   ESTIMATED BLOOD LOSS: Minimal.   PROCEDURE IN DETAIL: The patient was put to sleep with an LMA. The right breast was prepped and draped out as a sterile field. Timeout was performed. A circumareolar incision was made in the lower half, from the 3 to the 9 o'clock position, and the incision deepened through the skin. The areolar skin was then lifted up from the underlying tissue all the way past the nipple area. A firm spot was identified just behind the nipple, and this portion was completely excised out. This was sent to pathology for immediate processing. No other palpable or visible abnormality was noted.   Bleeding was controlled with cautery. The deeper layer was closed with interrupted 2-0 Vicryl. The skin was closed with subcuticular 4-0 Vicryl, covered with Dermabond.   The procedure was well tolerated. No immediate problems were encountered. She was returned to the recovery room in stable condition    ____________________________ S.Robinette Haines, MD sgs:MT D: 11/16/2013 08:27:42 ET T: 11/16/2013 08:57:50 ET JOB#: [id number]  cc: Synthia Innocent. Jamal Collin, MD, <Dictator> [facility] Robinette Haines MD ELECTRONICALLY SIGNED 11/18/2013 18:58

## 2014-06-25 NOTE — H&P (Signed)
PATIENT NAME:  Shelby Wang, Shelby Wang MR#:  [id number] DATE OF BIRTH:  12/22/1965  DATE OF ADMISSION:  11/23/2013  PRIMARY CARE PHYSICIAN: Nonlocal.   REFERRING PHYSICIAN: Rebecca L. Lord, MD   CHIEF COMPLAINT: Right breast swelling and redness.   HISTORY OF PRESENT ILLNESS: Shelby Wang is a 49 year old female who was diagnosed with BRCA2 gene, was noted to have a cyst, underwent excision of the cyst by the surgery about 2 days back. The patient noticed to have serosanguineous fluid; per patient, half a cup. Started to notice increased redness around the area. The patient was given doxycycline after the procedure. The patient states has been having increased redness, swelling, and pain. Concerning this, came to the Emergency Department. The patient has been afebrile, has normal white blood cell count. The patient received 1 dose of clindamycin in the Emergency Department.   PAST MEDICAL HISTORY:  1.  COPD.  2.  Cervical cancer.  3.  History of drug abuse.  4.  Agoraphobia.  5.  History of MRSA.  6.  Bipolar disorder.  7.  Anxiety.  8.  Total hysterectomy.   ALLERGIES: 1.  CODEINE.  2.  SULFA.  3.  TAPE. 4.  PENICILLIN.  5.  NONSTEROIDAL ANTI-INFLAMMATORY DRUGS.   HOME MEDICATIONS:  1.  Tramadol 50 mg every 6 hours as needed.  2.  Doxycycline 100 mg b.i.d.   SOCIAL HISTORY: Continues to smoke a pack a day. Drinks alcohol occasionally. Uses marijuana on a regular basis. Lives with her children.   FAMILY HISTORY: Breast cancer.   REVIEW OF SYSTEMS:  CONSTITUTIONAL: States has been experiencing generalized weakness.  EYES: No change in vision.  ENT: No change in hearing.  RESPIRATORY: Has cough, shortness of breath.  CARDIOVASCULAR: No chest pain, palpitations.  GASTROINTESTINAL: No nausea, vomiting, abdominal pain.  GENITOURINARY: No dysuria or hematuria.  HEMATOLOGIC: No easy bruising or bleeding.  SKIN: Has redness and swelling on the right breast.  NEUROLOGIC: No weakness or  numbness in any part of the body.   PHYSICAL EXAMINATION:  GENERAL: This is a well-built, well-nourished, age-appropriate female lying down in the bed, not in distress.  VITAL SIGNS: Temperature 98, pulse 71, blood pressure 101/80, respiratory rate of 20, oxygen saturation is 94% on room air.  HEENT: Head normocephalic, atraumatic. There is no scleral icterus. Conjunctivae normal. Pupils equal and react to light. Mucous membranes moist. No pharyngeal erythema.  NECK: Supple. No lymphadenopathy. No JVD. No carotid bruit.  CHEST: Has no focal tenderness. Has redness and swelling on the right breast. Surgical incision under the right nipple does not show any induration. LUNGS: Bilaterally clear to auscultation.  HEART: S1, S2 regular. No murmurs are heard.  ABDOMEN: Bowel sounds plus. Soft, nontender, nondistended.  EXTREMITIES: No pedal edema. Pulses 2+.  NEUROLOGIC: The patient is alert, oriented to place, person, and time. Cranial nerves II through XII intact. Motor 5/5 in upper and lower extremities.   LABORATORY DATA: CBC, CMP are completely within normal limits.   ASSESSMENT AND PLAN: Shelby Wang, a 49 year old female, comes with right breast cellulitis.  1.  Right breast cellulitis. Does not have any elevated white blood cell count. The patient failed outpatient treatment. Keep the patient on vancomycin. Pain management with Percocet.  2.  Tobacco use. Counseled with the patient.  3.  Keep the patient on deep vein thrombosis prophylaxis with Lovenox.   TIME SPENT: 50 minutes.    ____________________________ Monica Becton, MD pv:ST D: 11/23/2013 96:28:36 ET T: 11/23/2013 23:41:28  ET JOB#: [id number]  cc: Monica Becton, MD, <Dictator> Monica Becton MD ELECTRONICALLY SIGNED 12/01/2013 21:39

## 2014-06-27 LAB — SURGICAL PATHOLOGY

## 2014-06-28 ENCOUNTER — Ambulatory Visit (INDEPENDENT_AMBULATORY_CARE_PROVIDER_SITE_OTHER): Payer: Medicaid Other | Admitting: General Surgery

## 2014-06-28 ENCOUNTER — Encounter: Payer: Self-pay | Admitting: General Surgery

## 2014-06-28 VITALS — BP 124/68 | HR 70 | Resp 18 | Ht 59.0 in | Wt 126.0 lb

## 2014-06-28 DIAGNOSIS — Z1509 Genetic susceptibility to other malignant neoplasm: Principal | ICD-10-CM

## 2014-06-28 DIAGNOSIS — Z1501 Genetic susceptibility to malignant neoplasm of breast: Secondary | ICD-10-CM

## 2014-06-28 NOTE — Patient Instructions (Addendum)
Follow up with nurse Thursday for wound/drain check.

## 2014-06-28 NOTE — Progress Notes (Signed)
This is a 49 year old female here today for follow up of prophylactic bilateral mastectomy done on 06/23/14. She states she is still sore. Drain sheet present.   Bilateral incisions are clean and healing well. Serosanguinous drainage is over 30 ml daily each side over the past 3 days. Pathology report pending.   Continue to monitor drain output. Follow up with nurse Thursday for wound/drain check. Follow up in one week.  PCP:  No Pcp

## 2014-06-29 ENCOUNTER — Encounter: Payer: Self-pay | Admitting: General Surgery

## 2014-06-30 ENCOUNTER — Telehealth: Payer: Self-pay | Admitting: *Deleted

## 2014-06-30 ENCOUNTER — Ambulatory Visit: Payer: Medicaid Other

## 2014-06-30 NOTE — Telephone Encounter (Signed)
Phone call states the drainage is over 30 ml for the past 2 days. Right was 32 ml and 50 ml. The left was 50 ml and 40 ml. Appt for today cancelled, agrees to continue to monitor drainage and call if < 30 ml a day for 3 days. Keep appt with Dr Jamal Collin for next week.

## 2014-07-03 NOTE — Op Note (Signed)
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   POSTOPERATIVE DIAGNOSIS: BRCA1 carrier.   OPERATION: Prophylactic bilateral total mastectomy.   ANESTHESIA: General.   COMPLICATIONS: None.   ESTIMATED BLOOD LOSS: 50 mL.   DRAINS: Blake drain, one on each side.   DESCRIPTION OF PROCEDURE: The patient was put to sleep in the supine position on the operating table. Both breasts and the adjoining axillary regions were prepped and draped out as a sterile field. Timeout was performed. A right total mastectomy was performed first. Prior to this, the skin was marked for the incisions on both sides making it symmetrical. On the right breast, the skin incision was made using a plasma knife and the plasma knife was used in the entire dissection. A superior flap was created towards the infraclavicular space and the lower flap created to the upper rectus fascia. Bleeding was controlled easily with the use of the plasma knife. The breast, along with the overlying skin was then removed, along with the pectoral fascia from the underlying pectoralis major muscle and dissected from the medial to the lateral aspect. The lateral end of this breast mound was tagged for pathology. It was sent in formalin for pathologic assessment. After ensuring hemostasis, the wound was irrigated. The subcutaneous tissue was then closed with interrupted stitches of 2-0 Vicryl and the skin closed with 2 running sutures of subcuticular 3-0 Monocryl. The left mastectomy was then performed in a similar fashion. Skin incision was made and superior, inferior flaps created through the infraclavicular space and the upper rectus fascia respectively. The breast tissue, along with the pectoral fascia was dissected off the pectoralis muscle from the medial to the lateral aspect and removed. The lateral end was intact before sending to pathology. The  wound was irrigated and closed similar to the right side. Both incisions were then covered with LiquiBand. Fluffs and ABDs were placed and held in place with the use of a surgical bra. The patient subsequently was extubated and returned to the recovery room in stable condition. Both mastectomy sites were drained with Blake drains which were fastened to the drainage bulb. The patient subsequently was to be given full instructions on its care.    ____________________________ S.Robinette Haines, MD sgs:at D: 06/24/2014 08:12:16 ET T: 06/24/2014 09:41:42 ET JOB#: [id number]  cc: Synthia Innocent. Jamal Collin, MD, <Dictator> [facility] Robinette Haines MD ELECTRONICALLY SIGNED 06/24/2014 18:29

## 2014-07-06 ENCOUNTER — Ambulatory Visit (INDEPENDENT_AMBULATORY_CARE_PROVIDER_SITE_OTHER): Payer: Medicaid Other | Admitting: General Surgery

## 2014-07-06 ENCOUNTER — Encounter: Payer: Self-pay | Admitting: General Surgery

## 2014-07-06 VITALS — BP 116/74 | HR 74 | Resp 12 | Ht 59.0 in | Wt 127.0 lb

## 2014-07-06 DIAGNOSIS — Z1509 Genetic susceptibility to other malignant neoplasm: Principal | ICD-10-CM

## 2014-07-06 DIAGNOSIS — Z1501 Genetic susceptibility to malignant neoplasm of breast: Secondary | ICD-10-CM

## 2014-07-06 NOTE — Patient Instructions (Signed)
The patient is aware to call back for any questions or concerns.  

## 2014-07-06 NOTE — Progress Notes (Signed)
This is a 49 year old female here today for follow up of prophylactic bilateral mastectomy done on 06/23/14. She states she is still sore. Drain sheet present. She has had pain control issues. She did fall.  Incisions are clean. Drains removed. Pain management through [facility]. Follow up in 1 month.

## 2014-07-19 ENCOUNTER — Encounter: Payer: Self-pay | Admitting: General Surgery

## 2014-08-09 ENCOUNTER — Ambulatory Visit: Payer: Medicaid Other | Admitting: General Surgery

## 2014-08-24 ENCOUNTER — Encounter: Payer: Self-pay | Admitting: *Deleted

## 2014-11-15 ENCOUNTER — Encounter (HOSPITAL_BASED_OUTPATIENT_CLINIC_OR_DEPARTMENT_OTHER): Payer: Self-pay | Admitting: *Deleted

## 2014-11-18 ENCOUNTER — Encounter (HOSPITAL_BASED_OUTPATIENT_CLINIC_OR_DEPARTMENT_OTHER): Payer: Self-pay | Admitting: *Deleted

## 2014-11-21 ENCOUNTER — Encounter (HOSPITAL_BASED_OUTPATIENT_CLINIC_OR_DEPARTMENT_OTHER): Payer: Self-pay

## 2014-11-21 ENCOUNTER — Encounter (HOSPITAL_BASED_OUTPATIENT_CLINIC_OR_DEPARTMENT_OTHER)
Admission: RE | Disposition: A | Discharge status: Home / Self Care | Payer: Self-pay | Source: Ambulatory Visit | Attending: Specialist

## 2014-11-21 ENCOUNTER — Ambulatory Visit (HOSPITAL_BASED_OUTPATIENT_CLINIC_OR_DEPARTMENT_OTHER): Payer: Medicaid Other | Admitting: Anesthesiology

## 2014-11-21 ENCOUNTER — Ambulatory Visit (HOSPITAL_BASED_OUTPATIENT_CLINIC_OR_DEPARTMENT_OTHER)
Admission: RE | Admit: 2014-11-21 | Discharge: 2014-11-21 | Disposition: A | Discharge status: Home / Self Care | Payer: Medicaid Other | Source: Ambulatory Visit | Attending: Specialist | Admitting: Specialist

## 2014-11-21 DIAGNOSIS — Z8541 Personal history of malignant neoplasm of cervix uteri: Secondary | ICD-10-CM | POA: Insufficient documentation

## 2014-11-21 DIAGNOSIS — Z9013 Acquired absence of bilateral breasts and nipples: Secondary | ICD-10-CM | POA: Insufficient documentation

## 2014-11-21 DIAGNOSIS — Z853 Personal history of malignant neoplasm of breast: Secondary | ICD-10-CM | POA: Diagnosis not present

## 2014-11-21 DIAGNOSIS — Z85840 Personal history of malignant neoplasm of eye: Secondary | ICD-10-CM | POA: Insufficient documentation

## 2014-11-21 DIAGNOSIS — J45909 Unspecified asthma, uncomplicated: Secondary | ICD-10-CM | POA: Diagnosis not present

## 2014-11-21 DIAGNOSIS — F319 Bipolar disorder, unspecified: Secondary | ICD-10-CM | POA: Insufficient documentation

## 2014-11-21 DIAGNOSIS — Z79899 Other long term (current) drug therapy: Secondary | ICD-10-CM | POA: Diagnosis not present

## 2014-11-21 DIAGNOSIS — Z79891 Long term (current) use of opiate analgesic: Secondary | ICD-10-CM | POA: Diagnosis not present

## 2014-11-21 DIAGNOSIS — Z87891 Personal history of nicotine dependence: Secondary | ICD-10-CM | POA: Insufficient documentation

## 2014-11-21 DIAGNOSIS — F419 Anxiety disorder, unspecified: Secondary | ICD-10-CM | POA: Diagnosis not present

## 2014-11-21 DIAGNOSIS — J439 Emphysema, unspecified: Secondary | ICD-10-CM | POA: Diagnosis not present

## 2014-11-21 HISTORY — PX: BREAST RECONSTRUCTION: SHX9

## 2014-11-21 HISTORY — PX: TISSUE EXPANDER PLACEMENT: SHX2530

## 2014-11-21 HISTORY — DX: Anxiety disorder, unspecified: F41.9

## 2014-11-21 HISTORY — DX: Other chronic pain: G89.29

## 2014-11-21 LAB — POCT HEMOGLOBIN-HEMACUE: HEMOGLOBIN: 13.4 g/dL (ref 12.0–15.0)

## 2014-11-21 SURGERY — RECONSTRUCTION, BREAST
Anesthesia: General | Site: Breast | Laterality: Bilateral

## 2014-11-21 MED ORDER — SODIUM CHLORIDE 0.9 % IV SOLN
INTRAVENOUS | Status: DC | PRN
  Administered 2014-11-21: 50 mL

## 2014-11-21 MED ORDER — GLYCOPYRROLATE 0.2 MG/ML IJ SOLN
0.2000 mg | Freq: Once | INTRAMUSCULAR | Status: AC | PRN
  Administered 2014-11-21: 0.2 mg via INTRAVENOUS

## 2014-11-21 MED ORDER — DEXAMETHASONE SODIUM PHOSPHATE 4 MG/ML IJ SOLN
INTRAMUSCULAR | Status: DC | PRN
  Administered 2014-11-21: 10 mg via INTRAVENOUS

## 2014-11-21 MED ORDER — OXYCODONE HCL 5 MG/5ML PO SOLN: 5.0000 mg | Freq: Once | ORAL | Status: DC | PRN

## 2014-11-21 MED ORDER — HYDROMORPHONE HCL 1 MG/ML IJ SOLN
INTRAMUSCULAR | Status: AC
  Filled 2014-11-21: qty 1

## 2014-11-21 MED ORDER — SCOPOLAMINE 1 MG/3DAYS TD PT72: 1.0000 | MEDICATED_PATCH | Freq: Once | TRANSDERMAL | Status: DC | PRN

## 2014-11-21 MED ORDER — FENTANYL CITRATE (PF) 100 MCG/2ML IJ SOLN
INTRAMUSCULAR | Status: AC
  Filled 2014-11-21: qty 4

## 2014-11-21 MED ORDER — MEPERIDINE HCL 25 MG/ML IJ SOLN: 6.2500 mg | INTRAMUSCULAR | Status: DC | PRN

## 2014-11-21 MED ORDER — PHENYLEPHRINE HCL 10 MG/ML IJ SOLN
INTRAMUSCULAR | Status: DC | PRN
  Administered 2014-11-21 (×2): 40 ug via INTRAVENOUS

## 2014-11-21 MED ORDER — CIPROFLOXACIN IN D5W 400 MG/200ML IV SOLN
400.0000 mg | INTRAVENOUS | Status: AC
  Administered 2014-11-21: 400 mg via INTRAVENOUS

## 2014-11-21 MED ORDER — PHENYLEPHRINE HCL 10 MG/ML IJ SOLN
INTRAMUSCULAR | Status: AC
  Filled 2014-11-21: qty 1

## 2014-11-21 MED ORDER — HYDROMORPHONE HCL 1 MG/ML IJ SOLN
0.2500 mg | INTRAMUSCULAR | Status: DC | PRN
  Administered 2014-11-21 (×2): 0.5 mg via INTRAVENOUS

## 2014-11-21 MED ORDER — SUCCINYLCHOLINE CHLORIDE 20 MG/ML IJ SOLN
INTRAMUSCULAR | Status: DC | PRN
  Administered 2014-11-21: 50 mg via INTRAVENOUS

## 2014-11-21 MED ORDER — LACTATED RINGERS IV SOLN
INTRAVENOUS | Status: DC
  Administered 2014-11-21: 07:00:00 via INTRAVENOUS

## 2014-11-21 MED ORDER — MIDAZOLAM HCL 5 MG/5ML IJ SOLN
INTRAMUSCULAR | Status: DC | PRN
  Administered 2014-11-21: 2 mg via INTRAVENOUS

## 2014-11-21 MED ORDER — FENTANYL CITRATE (PF) 100 MCG/2ML IJ SOLN: 50.0000 ug | INTRAMUSCULAR | Status: DC | PRN

## 2014-11-21 MED ORDER — SCOPOLAMINE 1 MG/3DAYS TD PT72
MEDICATED_PATCH | TRANSDERMAL | Status: DC | PRN
  Administered 2014-11-21: 1 via TRANSDERMAL

## 2014-11-21 MED ORDER — GLYCOPYRROLATE 0.2 MG/ML IJ SOLN
INTRAMUSCULAR | Status: AC
  Filled 2014-11-21: qty 1

## 2014-11-21 MED ORDER — LIDOCAINE HCL (CARDIAC) 20 MG/ML IV SOLN
INTRAVENOUS | Status: AC
  Filled 2014-11-21: qty 5

## 2014-11-21 MED ORDER — FENTANYL CITRATE (PF) 100 MCG/2ML IJ SOLN
INTRAMUSCULAR | Status: AC
Start: 2014-11-21 — End: 2014-11-21
  Filled 2014-11-21: qty 4

## 2014-11-21 MED ORDER — ONDANSETRON HCL 4 MG/2ML IJ SOLN
INTRAMUSCULAR | Status: DC | PRN
  Administered 2014-11-21: 4 mg via INTRAVENOUS

## 2014-11-21 MED ORDER — CIPROFLOXACIN IN D5W 400 MG/200ML IV SOLN
INTRAVENOUS | Status: AC
  Filled 2014-11-21: qty 200

## 2014-11-21 MED ORDER — PROPOFOL 10 MG/ML IV BOLUS
INTRAVENOUS | Status: AC
  Filled 2014-11-21: qty 20

## 2014-11-21 MED ORDER — LIDOCAINE HCL (CARDIAC) 20 MG/ML IV SOLN
INTRAVENOUS | Status: DC | PRN
  Administered 2014-11-21: 50 mg via INTRAVENOUS

## 2014-11-21 MED ORDER — LIDOCAINE-EPINEPHRINE 0.5 %-1:200000 IJ SOLN
INTRAMUSCULAR | Status: DC | PRN
  Administered 2014-11-21: 50 mL

## 2014-11-21 MED ORDER — OXYCODONE HCL 5 MG PO TABS
5.0000 mg | ORAL_TABLET | Freq: Once | ORAL | Status: AC
  Administered 2014-11-21: 5 mg via ORAL

## 2014-11-21 MED ORDER — HYDROMORPHONE HCL 1 MG/ML IJ SOLN
0.2500 mg | INTRAMUSCULAR | Status: DC | PRN
  Administered 2014-11-21 (×4): 0.5 mg via INTRAVENOUS

## 2014-11-21 MED ORDER — FENTANYL CITRATE (PF) 100 MCG/2ML IJ SOLN
INTRAMUSCULAR | Status: DC | PRN
  Administered 2014-11-21: 50 ug via INTRAVENOUS
  Administered 2014-11-21: 100 ug via INTRAVENOUS
  Administered 2014-11-21: 50 ug via INTRAVENOUS

## 2014-11-21 MED ORDER — PROPOFOL 10 MG/ML IV BOLUS
INTRAVENOUS | Status: DC | PRN
  Administered 2014-11-21: 200 mg via INTRAVENOUS
  Administered 2014-11-21 (×2): 100 mg via INTRAVENOUS

## 2014-11-21 MED ORDER — ONDANSETRON HCL 4 MG/2ML IJ SOLN
INTRAMUSCULAR | Status: AC
  Filled 2014-11-21: qty 2

## 2014-11-21 MED ORDER — LIDOCAINE-EPINEPHRINE 0.5 %-1:200000 IJ SOLN
INTRAMUSCULAR | Status: AC
  Filled 2014-11-21: qty 2

## 2014-11-21 MED ORDER — MIDAZOLAM HCL 2 MG/2ML IJ SOLN
INTRAMUSCULAR | Status: AC
  Filled 2014-11-21: qty 4

## 2014-11-21 MED ORDER — MIDAZOLAM HCL 2 MG/2ML IJ SOLN: 1.0000 mg | INTRAMUSCULAR | Status: DC | PRN

## 2014-11-21 MED ORDER — OXYCODONE HCL 5 MG PO TABS
ORAL_TABLET | ORAL | Status: AC
  Filled 2014-11-21: qty 2

## 2014-11-21 MED ORDER — SCOPOLAMINE 1 MG/3DAYS TD PT72
MEDICATED_PATCH | TRANSDERMAL | Status: AC
  Filled 2014-11-21: qty 1

## 2014-11-21 MED ORDER — CEFAZOLIN SODIUM 1 G IJ SOLR
INTRAMUSCULAR | Status: AC
  Filled 2014-11-21: qty 20

## 2014-11-21 MED ORDER — SODIUM CHLORIDE 0.9 % IV SOLN: INTRAVENOUS | Status: DC | PRN

## 2014-11-21 MED ORDER — OXYCODONE HCL 5 MG PO TABS: 5.0000 mg | ORAL_TABLET | Freq: Once | ORAL | Status: DC | PRN

## 2014-11-21 MED ORDER — ALBUTEROL SULFATE HFA 108 (90 BASE) MCG/ACT IN AERS
INHALATION_SPRAY | RESPIRATORY_TRACT | Status: DC | PRN
  Administered 2014-11-21: 2 via RESPIRATORY_TRACT

## 2014-11-21 SURGICAL SUPPLY — 72 items
BAG DECANTER FOR FLEXI CONT (MISCELLANEOUS) ×4 IMPLANT
BENZOIN TINCTURE PRP APPL 2/3 (GAUZE/BANDAGES/DRESSINGS) ×4 IMPLANT
BINDER BREAST MEDIUM (GAUZE/BANDAGES/DRESSINGS) ×4 IMPLANT
BLADE HEX COATED 2.75 (ELECTRODE) ×4 IMPLANT
BLADE KNIFE PERSONA 10 (BLADE) ×4 IMPLANT
BLADE KNIFE PERSONA 15 (BLADE) ×4 IMPLANT
CANISTER SUCT 1200ML W/VALVE (MISCELLANEOUS) ×4 IMPLANT
COVER BACK TABLE 60X90IN (DRAPES) ×4 IMPLANT
COVER MAYO STAND STRL (DRAPES) ×4 IMPLANT
DECANTER SPIKE VIAL GLASS SM (MISCELLANEOUS) ×4 IMPLANT
DRAIN CHANNEL 10M FLAT 3/4 FLT (DRAIN) ×4 IMPLANT
DRAIN CHANNEL 7F 3/4 FLAT (WOUND CARE) IMPLANT
DRAIN PENROSE 1/4X12 LTX STRL (WOUND CARE) IMPLANT
DRAPE LAPAROSCOPIC ABDOMINAL (DRAPES) ×4 IMPLANT
DRSG PAD ABDOMINAL 8X10 ST (GAUZE/BANDAGES/DRESSINGS) ×16 IMPLANT
ELECT BLADE 6.5 .24CM SHAFT (ELECTRODE) ×4 IMPLANT
ELECT REM PT RETURN 9FT ADLT (ELECTROSURGICAL) ×4
ELECTRODE REM PT RTRN 9FT ADLT (ELECTROSURGICAL) ×2 IMPLANT
EVACUATOR SILICONE 100CC (DRAIN) ×4 IMPLANT
FILTER 7/8 IN (FILTER) ×4 IMPLANT
GAUZE SPONGE 4X4 12PLY STRL (GAUZE/BANDAGES/DRESSINGS) ×4 IMPLANT
GAUZE XEROFORM 1X8 LF (GAUZE/BANDAGES/DRESSINGS) IMPLANT
GAUZE XEROFORM 5X9 LF (GAUZE/BANDAGES/DRESSINGS) ×4 IMPLANT
GLOVE BIO SURGEON STRL SZ 6.5 (GLOVE) ×3 IMPLANT
GLOVE BIO SURGEONS STRL SZ 6.5 (GLOVE) ×1
GLOVE BIOGEL M STRL SZ7.5 (GLOVE) ×4 IMPLANT
GLOVE BIOGEL PI IND STRL 8 (GLOVE) ×2 IMPLANT
GLOVE BIOGEL PI INDICATOR 8 (GLOVE) ×2
GLOVE ECLIPSE 7.0 STRL STRAW (GLOVE) ×4 IMPLANT
GOWN STRL REUS W/ TWL LRG LVL3 (GOWN DISPOSABLE) IMPLANT
GOWN STRL REUS W/ TWL XL LVL3 (GOWN DISPOSABLE) ×2 IMPLANT
GOWN STRL REUS W/TWL LRG LVL3 (GOWN DISPOSABLE)
GOWN STRL REUS W/TWL XL LVL3 (GOWN DISPOSABLE) ×2
IMPLANT BREAST 480CC (Breast) ×8 IMPLANT
IV NS 500ML (IV SOLUTION) ×8
IV NS 500ML BAXH (IV SOLUTION) ×8 IMPLANT
KIT FILL SYSTEM UNIVERSAL (SET/KITS/TRAYS/PACK) ×4 IMPLANT
NDL SAFETY ECLIPSE 18X1.5 (NEEDLE) IMPLANT
NEEDLE HYPO 18GX1.5 SHARP (NEEDLE)
NEEDLE HYPO 25X1 1.5 SAFETY (NEEDLE) ×4 IMPLANT
NEEDLE SPNL 18GX3.5 QUINCKE PK (NEEDLE) ×4 IMPLANT
NS IRRIG 1000ML POUR BTL (IV SOLUTION) IMPLANT
PACK BASIN DAY SURGERY FS (CUSTOM PROCEDURE TRAY) ×4 IMPLANT
PEN SKIN MARKING BROAD TIP (MISCELLANEOUS) ×4 IMPLANT
PIN SAFETY STERILE (MISCELLANEOUS) IMPLANT
SLEEVE SCD COMPRESS KNEE MED (MISCELLANEOUS) ×4 IMPLANT
SPECIMEN JAR MEDIUM (MISCELLANEOUS) ×8 IMPLANT
SPONGE GAUZE 4X4 12PLY STER LF (GAUZE/BANDAGES/DRESSINGS) IMPLANT
SPONGE LAP 18X18 X RAY DECT (DISPOSABLE) ×8 IMPLANT
STRIP SUTURE WOUND CLOSURE 1/2 (SUTURE) ×8 IMPLANT
SUT ETHILON 3 0 PS 1 (SUTURE) IMPLANT
SUT MNCRL AB 3-0 PS2 18 (SUTURE) ×4 IMPLANT
SUT MON AB 2-0 CT1 36 (SUTURE) ×4 IMPLANT
SUT MON AB 5-0 PS2 18 (SUTURE) IMPLANT
SUT PROLENE 3 0 PS 1 (SUTURE) ×4 IMPLANT
SUT PROLENE 3 0 PS 2 (SUTURE) IMPLANT
SUT PROLENE 4 0 PS 2 18 (SUTURE) IMPLANT
SYR 20CC LL (SYRINGE) ×4 IMPLANT
SYR 50ML LL SCALE MARK (SYRINGE) ×8 IMPLANT
SYR BULB IRRIGATION 50ML (SYRINGE) ×4 IMPLANT
SYR CONTROL 10ML LL (SYRINGE) ×4 IMPLANT
TAPE HYPAFIX 6 X30' (GAUZE/BANDAGES/DRESSINGS) ×1
TAPE HYPAFIX 6X30 (GAUZE/BANDAGES/DRESSINGS) ×3 IMPLANT
TAPE MEASURE 72IN RETRACT (INSTRUMENTS)
TAPE MEASURE LINEN 72IN RETRCT (INSTRUMENTS) IMPLANT
TOWEL OR 17X24 6PK STRL BLUE (TOWEL DISPOSABLE) ×16 IMPLANT
TRAY DSU PREP LF (CUSTOM PROCEDURE TRAY) ×4 IMPLANT
TUBE CONNECTING 20'X1/4 (TUBING) ×2
TUBE CONNECTING 20X1/4 (TUBING) ×6 IMPLANT
UNDERPAD 30X30 (UNDERPADS AND DIAPERS) ×8 IMPLANT
VAC PENCILS W/TUBING CLEAR (MISCELLANEOUS) ×4 IMPLANT
YANKAUER SUCT BULB TIP NO VENT (SUCTIONS) ×4 IMPLANT

## 2014-11-21 NOTE — Transfer of Care (Signed)
Immediate Anesthesia Transfer of Care Note  Patient: Shelby Wang  Procedure(s) Performed: Procedure(s): BILATERAL BREAST RECONSTRUCTION WITH PLACEMENT OF TISSUE EXPANDER (Bilateral) TISSUE EXPANDER (Bilateral)  Patient Location: PACU  Anesthesia Type:General  Level of Consciousness: awake and patient cooperative  Airway & Oxygen Therapy: Patient Spontanous Breathing and Patient connected to face mask oxygen  Post-op Assessment: Report given to RN and Post -op Vital signs reviewed and stable  Post vital signs: Reviewed and stable  Last Vitals:  Filed Vitals:   11/21/14 0649  BP: 133/69  Pulse: 73  Temp: 36.6 C  Resp: 18    Complications: No apparent anesthesia complications

## 2014-11-21 NOTE — H&P (Signed)
Shelby Wang is an 49 y.o. female.   Chief Complaint:Breast cancer in the past with bilateral mastectomies HPI: RIGHT BREAST CANCER AND UNDERWENT BILATERAL MASTECTOMIES  Past Medical History  Diagnosis Date  . Herniated disc   . Asthma   . Arthritis   . Urethral diverticulum   . Cancer     cervical cancer s/p hysterectomy  . Cervical cancer   . Atrophic vaginitis   . Seizures   . Bipolar disorder   . COPD (chronic obstructive pulmonary disease)   . Emphysema/COPD   . Eye cancer 2013    left eye  . Chronic pain     goes to pain clinic  . Anxiety     Past Surgical History  Procedure Laterality Date  . Cesarean section    . Total abdominal hysterectomy w/ bilateral salpingoophorectomy    . Vaginal prolapse repair  2012  . Tooth extraction  06-09-14  . Abdominal hysterectomy      cervical cancer age 62  . Breast biopsy Right 11-16-13    BRCA II positive  . Mastectomy Bilateral 06-23-14    Family History  Problem Relation Age of Onset  . Cancer Mother     ovarian, breast  . Heart disease Mother   . Cancer Father   . Cancer Sister     brain  . Breast cancer Sister   . Breast cancer Maternal Aunt   . BRCA 1/2 Son 94    positive  . BRCA 1/2 Daughter 25    negative   Social History:  reports that she quit smoking about 2 weeks ago. Her smoking use included Cigarettes. She smoked 1.00 pack per day. She has never used smokeless tobacco. She reports that she does not drink alcohol or use illicit drugs.  Allergies:  Allergies  Allergen Reactions  . Adhesive [Tape] Other (See Comments)    Tears skin  . Codeine Hives  . Nsaids Other (See Comments)    Upset stomach  . Sulfa Antibiotics Hives  . Amoxicillin Rash  . Penicillins Rash    Medications Prior to Admission  Medication Sig Dispense Refill  . albuterol (VENTOLIN HFA) 108 (90 BASE) MCG/ACT inhaler Inhale 2 puffs into the lungs every 4 (four) hours as needed for wheezing.     . clonazePAM (KLONOPIN) 0.5 MG  tablet Take 0.5 mg by mouth 3 (three) times daily as needed for anxiety.    Marland Kitchen oxyCODONE-acetaminophen (PERCOCET) 10-325 MG per tablet Take 1 tablet by mouth every 4 (four) hours as needed for pain.    Marland Kitchen sertraline (ZOLOFT) 100 MG tablet Take 100 mg by mouth daily.    . Tiotropium Bromide Monohydrate (SPIRIVA HANDIHALER IN) Inhale 1 capsule into the lungs daily.     . traZODone (DESYREL) 100 MG tablet Take 100 mg by mouth at bedtime.    Marland Kitchen albuterol (PROVENTIL) (2.5 MG/3ML) 0.083% nebulizer solution 2.5 mg.      No results found for this or any previous visit (from the past 48 hour(s)). No results found.  Review of Systems  Constitutional: Negative.   HENT: Negative.   Eyes: Negative.   Respiratory: Negative.   Cardiovascular: Negative.   Gastrointestinal: Negative.   Genitourinary: Negative.   Musculoskeletal: Negative.   Skin: Negative.   Neurological: Negative.   Endo/Heme/Allergies: Negative.   Psychiatric/Behavioral: Negative.     Blood pressure 133/69, pulse 73, temperature 97.9 F (36.6 C), temperature source Oral, resp. rate 18, height 4' 11"  (1.499 m), weight 59.138 kg (130  lb 6 oz), SpO2 99 %. Physical Exam   Assessment/Plan Breast cancer with bilateral mastectomies for tissue expansion first stage breast reconstructions  TRUESDALE,GERALD L 11/21/2014, 7:20 AM

## 2014-11-21 NOTE — Anesthesia Procedure Notes (Signed)
Procedure Name: Intubation Date/Time: 11/21/2014 7:38 AM Performed by: Marrianne Mood Pre-anesthesia Checklist: Patient identified, Emergency Drugs available, Suction available, Patient being monitored and Timeout performed Patient Re-evaluated:Patient Re-evaluated prior to inductionOxygen Delivery Method: Circle System Utilized Preoxygenation: Pre-oxygenation with 100% oxygen Intubation Type: IV induction Ventilation: Mask ventilation without difficulty Laryngoscope Size: Miller and 3 Tube type: Oral Tube size: 7.0 mm Number of attempts: 1 Airway Equipment and Method: Stylet and Oral airway Placement Confirmation: ETT inserted through vocal cords under direct vision,  positive ETCO2 and breath sounds checked- equal and bilateral Secured at: 21 cm Tube secured with: Tape Dental Injury: Teeth and Oropharynx as per pre-operative assessment

## 2014-11-21 NOTE — Anesthesia Postprocedure Evaluation (Signed)
  Anesthesia Post-op Note  Patient: Shelby Wang  Procedure(s) Performed: Procedure(s): BILATERAL BREAST RECONSTRUCTION WITH PLACEMENT OF TISSUE EXPANDER (Bilateral) TISSUE EXPANDER (Bilateral)  Patient Location: PACU  Anesthesia Type: General   Level of Consciousness: awake, alert  and oriented  Airway and Oxygen Therapy: Patient Spontanous Breathing  Post-op Pain: mild  Post-op Assessment: Post-op Vital signs reviewed  Post-op Vital Signs: Reviewed  Last Vitals:  Filed Vitals:   11/21/14 1045  BP: 131/86  Pulse: 89  Temp: 37.1 C  Resp: 18    Complications: No apparent anesthesia complications

## 2014-11-21 NOTE — Discharge Instructions (Signed)
Activity As tolerated: NO showers NO driving No heavy activities  Diet:regular No restrictions:  Wound Care: Keep dressing clean & dry  Do not change dressings Do not raise arms up  Call Doctor if any unusual problems occur such as pain, excessive Bleeding, unrelieved Nausea/vomiting, Fever &/or chills When lying down, keep head elevated on 2-3 pillows or back-rest  Follow-up appointment: Please call the office.  Call your surgeon if you experience:   1.  Fever over 101.0. 2.  Inability to urinate. 3.  Nausea and/or vomiting. 4.  Extreme swelling or bruising at the surgical site. 5.  Continued bleeding from the incision. 6.  Increased pain, redness or drainage from the incision. 7.  Problems related to your pain medication. 8. Any change in color, movement and/or sensation 9. Any problems and/or concerns   Post Anesthesia Home Care Instructions  Activity: Get plenty of rest for the remainder of the day. A responsible adult should stay with you for 24 hours following the procedure.  For the next 24 hours, DO NOT: -Drive a car -Paediatric nurse -Drink alcoholic beverages -Take any medication unless instructed by your physician -Make any legal decisions or sign important papers.  Meals: Start with liquid foods such as gelatin or soup. Progress to regular foods as tolerated. Avoid greasy, spicy, heavy foods. If nausea and/or vomiting occur, drink only clear liquids until the nausea and/or vomiting subsides. Call your physician if vomiting continues.  Special Instructions/Symptoms: Your throat may feel dry or sore from the anesthesia or the breathing tube placed in your throat during surgery. If this causes discomfort, gargle with warm salt water. The discomfort should disappear within 24 hours.  If you had a scopolamine patch placed behind your ear for the management of post- operative nausea and/or vomiting:  1. The medication in the patch is effective for 72 hours,  after which it should be removed.  Wrap patch in a tissue and discard in the trash. Wash hands thoroughly with soap and water. 2. You may remove the patch earlier than 72 hours if you experience unpleasant side effects which may include dry mouth, dizziness or visual disturbances. 3. Avoid touching the patch. Wash your hands with soap and water after contact with the patch.

## 2014-11-21 NOTE — Anesthesia Preprocedure Evaluation (Signed)
Anesthesia Evaluation  Patient identified by MRN, date of birth, ID band Patient awake    Reviewed: Allergy & Precautions, NPO status , Patient's Chart, lab work & pertinent test results  Airway Mallampati: I  TM Distance: >3 FB Neck ROM: Full    Dental  (+) Edentulous Upper, Edentulous Lower, Dental Advisory Given   Pulmonary asthma , COPD,  COPD inhaler, former smoker,    breath sounds clear to auscultation       Cardiovascular  Rhythm:Regular Rate:Normal     Neuro/Psych    GI/Hepatic   Endo/Other    Renal/GU      Musculoskeletal   Abdominal   Peds  Hematology   Anesthesia Other Findings   Reproductive/Obstetrics                             Anesthesia Physical Anesthesia Plan  ASA: II  Anesthesia Plan: General   Post-op Pain Management:    Induction: Intravenous  Airway Management Planned: Oral ETT  Additional Equipment:   Intra-op Plan:   Post-operative Plan: Extubation in OR  Informed Consent: I have reviewed the patients History and Physical, chart, labs and discussed the procedure including the risks, benefits and alternatives for the proposed anesthesia with the patient or authorized representative who has indicated his/her understanding and acceptance.   Dental advisory given  Plan Discussed with: CRNA, Anesthesiologist and Surgeon  Anesthesia Plan Comments:         Anesthesia Quick Evaluation

## 2014-11-21 NOTE — Brief Op Note (Signed)
11/21/2014  9:03 AM  PATIENT:  Romona L Denzler  49 y.o. female  PRE-OPERATIVE DIAGNOSIS:  BREAST CANCER   POST-OPERATIVE DIAGNOSIS:  BREAST CANCER   PROCEDURE:  Procedure(s): BILATERAL BREAST RECONSTRUCTION WITH PLACEMENT OF TISSUE EXPANDER (Bilateral) TISSUE EXPANDER (Bilateral)  SURGEON:  Surgeon(s) and Role:    * Cristine Polio, MD - Primary  PHYSICIAN ASSISTANT:   ASSISTANTS: none   ANESTHESIA:   general  EBL:  Total I/O In: 1000 [I.V.:1000] Out: -   BLOOD ADMINISTERED:none  DRAINS: none   LOCAL MEDICATIONS USED:  LIDOCAINE   SPECIMEN:  Excision  DISPOSITION OF SPECIMEN:  PATHOLOGY  COUNTS:  YES  TOURNIQUET:  * No tourniquets in log *  DICTATION: .Other Dictation: Dictation Number [id number]  PLAN OF CARE: Discharge to home after PACU  PATIENT DISPOSITION:  PACU - hemodynamically stable.   Delay start of Pharmacological VTE agent (>24hrs) due to surgical blood loss or risk of bleeding: yes

## 2014-11-22 ENCOUNTER — Encounter (HOSPITAL_BASED_OUTPATIENT_CLINIC_OR_DEPARTMENT_OTHER): Payer: Self-pay | Admitting: Specialist

## 2014-11-22 NOTE — Op Note (Signed)
NAMEIRELYND, ZUMSTEIN               ACCOUNT NO.:  [id number]  MEDICAL RECORD NO.:  [id number]  LOCATION:                               FACILITY:  [facility]  PHYSICIAN:  Berneta Sages L. Towanda Malkin, M.D.DATE OF BIRTH:  [birth year]  DATE OF PROCEDURE:  11/21/2014 DATE OF DISCHARGE:  11/21/2014                              OPERATIVE REPORT   SURGEON:  Berneta Sages L. Towanda Malkin, M.D.  INDICATION FOR PROCEDURE:  A 49 year old lady with left breast cancer by history, status post bilateral mastectomies.  PROCEDURES PLANNED:  First stage reconstruction of the breast using tissue expansion with the Mentor lot number [id number], serial number [id number] on the left side and [id number] on the right.  Mentor implantation.  Total fill volume is 460 mL.  PROCEDURE DESCRIPTION:  Preoperatively, the patient was sat up and drawn for the midline of the chest as well as the inframammary fold reconstructions and the lateral lines.  She underwent general anesthesia, intubated orally.  Prep was done to the chest and breast areas in routine fashion using Hibiclens soap and solution, walled off with sterile towels and drapes so as to make a sterile field.  One-half- percent Xylocaine with epinephrine injected locally for vasoconstriction.  The previous midline transverse incision was opened down to skin and subcutaneous tissue, underlying pectoralis major muscle.  The muscle was divided in direction of its fibers, and we did a blunt dissection superiorly; and then using the Bovie, we were able to dissect off the fifth rib down to the inframammary area and out laterally to make a large space.  Hemostasis was maintained with the Bovie and on coagulation.  Next, right and left sides were reconstructed with Mentor implants as mentioned above bilaterally.  Muscle was then repaired back with 2-0 Monocryl, subcutaneous tissue with 2-0 Monocryl, and then the skin edges were reapproximated with a running subcuticular stitch of 3-0  Monocryl.  Steri-Strips and soft dressings were applied to all the areas.  The patient tolerated all procedure very well, first staged reconstructions.  Soft dressings were applied; 4x4s, ABDs, and breast garment.  She withstood the procedures very well.  Estimated blood loss was 60-70 mL.  There were no complications.     Odella Aquas. Towanda Malkin, M.D.     Elie Confer  D:  11/21/2014  T:  11/21/2014  Job:  [id number]

## 2014-12-03 ENCOUNTER — Encounter (HOSPITAL_COMMUNITY): Payer: Self-pay | Admitting: *Deleted

## 2014-12-03 ENCOUNTER — Emergency Department (HOSPITAL_COMMUNITY)
Admission: EM | Admit: 2014-12-03 | Discharge: 2014-12-03 | Disposition: A | Discharge status: Home / Self Care | Payer: Medicaid Other | Attending: Emergency Medicine | Admitting: Emergency Medicine

## 2014-12-03 DIAGNOSIS — Z79899 Other long term (current) drug therapy: Secondary | ICD-10-CM | POA: Insufficient documentation

## 2014-12-03 DIAGNOSIS — F419 Anxiety disorder, unspecified: Secondary | ICD-10-CM | POA: Insufficient documentation

## 2014-12-03 DIAGNOSIS — Z8742 Personal history of other diseases of the female genital tract: Secondary | ICD-10-CM | POA: Insufficient documentation

## 2014-12-03 DIAGNOSIS — F319 Bipolar disorder, unspecified: Secondary | ICD-10-CM | POA: Diagnosis not present

## 2014-12-03 DIAGNOSIS — Z87891 Personal history of nicotine dependence: Secondary | ICD-10-CM | POA: Insufficient documentation

## 2014-12-03 DIAGNOSIS — Z85840 Personal history of malignant neoplasm of eye: Secondary | ICD-10-CM | POA: Diagnosis not present

## 2014-12-03 DIAGNOSIS — Z8541 Personal history of malignant neoplasm of cervix uteri: Secondary | ICD-10-CM | POA: Diagnosis not present

## 2014-12-03 DIAGNOSIS — R079 Chest pain, unspecified: Secondary | ICD-10-CM | POA: Diagnosis present

## 2014-12-03 DIAGNOSIS — J439 Emphysema, unspecified: Secondary | ICD-10-CM | POA: Insufficient documentation

## 2014-12-03 DIAGNOSIS — Z9889 Other specified postprocedural states: Secondary | ICD-10-CM | POA: Insufficient documentation

## 2014-12-03 DIAGNOSIS — G8929 Other chronic pain: Secondary | ICD-10-CM | POA: Insufficient documentation

## 2014-12-03 DIAGNOSIS — G8918 Other acute postprocedural pain: Secondary | ICD-10-CM | POA: Insufficient documentation

## 2014-12-03 DIAGNOSIS — R0789 Other chest pain: Secondary | ICD-10-CM

## 2014-12-03 DIAGNOSIS — Z8739 Personal history of other diseases of the musculoskeletal system and connective tissue: Secondary | ICD-10-CM | POA: Insufficient documentation

## 2014-12-03 DIAGNOSIS — Z880 Allergy status to penicillin: Secondary | ICD-10-CM | POA: Insufficient documentation

## 2014-12-03 LAB — BASIC METABOLIC PANEL WITH GFR
Anion gap: 6 (ref 5–15)
BUN: 11 mg/dL (ref 6–20)
CO2: 27 mmol/L (ref 22–32)
Calcium: 8.3 mg/dL — ABNORMAL LOW (ref 8.9–10.3)
Chloride: 105 mmol/L (ref 101–111)
Creatinine, Ser: 0.62 mg/dL (ref 0.44–1.00)
GFR calc Af Amer: >60 mL/min
GFR calc non Af Amer: >60 mL/min
Glucose, Bld: 100 mg/dL — ABNORMAL HIGH (ref 65–99)
Potassium: 3.8 mmol/L (ref 3.5–5.1)
Sodium: 138 mmol/L (ref 135–145)

## 2014-12-03 LAB — CBC WITH DIFFERENTIAL/PLATELET
Basophils Absolute: 0.1 K/uL (ref 0.0–0.1)
Basophils Relative: 1 %
Eosinophils Absolute: 0.2 K/uL (ref 0.0–0.7)
Eosinophils Relative: 2 %
HCT: 32.2 % — ABNORMAL LOW (ref 36.0–46.0)
Hemoglobin: 10.5 g/dL — ABNORMAL LOW (ref 12.0–15.0)
Lymphocytes Relative: 29 %
Lymphs Abs: 1.9 K/uL (ref 0.7–4.0)
MCH: 30.9 pg (ref 26.0–34.0)
MCHC: 32.6 g/dL (ref 30.0–36.0)
MCV: 94.7 fL (ref 78.0–100.0)
Monocytes Absolute: 0.5 K/uL (ref 0.1–1.0)
Monocytes Relative: 7 %
Neutro Abs: 4.1 K/uL (ref 1.7–7.7)
Neutrophils Relative %: 61 %
Platelets: 276 K/uL (ref 150–400)
RBC: 3.4 MIL/uL — ABNORMAL LOW (ref 3.87–5.11)
RDW: 14.4 % (ref 11.5–15.5)
WBC: 6.7 K/uL (ref 4.0–10.5)

## 2014-12-03 MED ORDER — HYDROMORPHONE HCL 1 MG/ML IJ SOLN
1.0000 mg | Freq: Once | INTRAMUSCULAR | Status: AC
  Administered 2014-12-03: 1 mg via INTRAVENOUS
  Filled 2014-12-03: qty 1

## 2014-12-03 MED ORDER — ONDANSETRON HCL 4 MG/2ML IJ SOLN
4.0000 mg | Freq: Once | INTRAMUSCULAR | Status: AC
  Administered 2014-12-03: 4 mg via INTRAVENOUS
  Filled 2014-12-03: qty 2

## 2014-12-03 MED ORDER — HYDROMORPHONE HCL 1 MG/ML IJ SOLN
1.0000 mg | Freq: Once | INTRAMUSCULAR | Status: DC
  Filled 2014-12-03: qty 1

## 2014-12-03 NOTE — ED Notes (Signed)
Bed: EZ66 Expected date:  Expected time:  Means of arrival:  Comments: EMS- bilateral chest pain post mastectomy

## 2014-12-03 NOTE — ED Provider Notes (Signed)
CSN: [id number]     Arrival date & time 12/03/14  1617 History   First MD Initiated Contact with Patient 12/03/14 1631     Chief Complaint  Patient presents with  . Post-op Problem     (Consider location/radiation/quality/duration/timing/severity/associated sxs/prior Treatment) HPI Comments: Shelby Wang is a 49 y.o F with recent bilateral mastectomy, s/p bilateral breast reconstruction with placement of tissue expander on 9/19 who presents to the emergency room today complaining of bilateral chest pain. Patient states she saw her plastic surgery and on 9/26 who asked her "heavy been keeping the drainage tubes clean" to which the patient responded "you did not place any". Patient is concerned that she was supposed to have drains placed that were never there. Patient has not had any complications until yesterday when she began having excruciating bilateral chest wall pain surrounding incision sites. Worse on the left. Patient states "it feels like whenever he put in there has fallen out and now it's burning". Patient attempted to call plastic surgery and multiple times a day with no response. Patient's incision sites are now red and warm to the touch. No drainage noted. Denies fevers that she is aware of however experiences chills. Denies shortness of breath, nausea,headache, numbness, weakness.   The history is provided by the patient.    Past Medical History  Diagnosis Date  . Herniated disc   . Asthma   . Arthritis   . Urethral diverticulum   . Cancer [facility])     cervical cancer s/p hysterectomy  . Cervical cancer ([place])   . Atrophic vaginitis   . Seizures ([place])   . Bipolar disorder ([place])   . COPD (chronic obstructive pulmonary disease) ([place])   . Emphysema/COPD ([place])   . Eye cancer [facility]) 2013    left eye  . Chronic pain     goes to pain clinic  . Anxiety    Past Surgical History  Procedure Laterality Date  . Cesarean section    . Total abdominal hysterectomy w/ bilateral  salpingoophorectomy    . Vaginal prolapse repair  2012  . Tooth extraction  06-09-14  . Abdominal hysterectomy      cervical cancer age 60  . Breast biopsy Right 11-16-13    BRCA II positive  . Mastectomy Bilateral 06-23-14  . Breast reconstruction Bilateral 11/21/2014    Procedure: BILATERAL BREAST RECONSTRUCTION WITH PLACEMENT OF TISSUE EXPANDER;  Surgeon: Cristine Polio, MD;  Location: [facility];  Service: Plastics;  Laterality: Bilateral;  . Tissue expander placement Bilateral 11/21/2014    Procedure: TISSUE EXPANDER;  Surgeon: Cristine Polio, MD;  Location: [facility];  Service: Plastics;  Laterality: Bilateral;   Family History  Problem Relation Age of Onset  . Cancer Mother     ovarian, breast  . Heart disease Mother   . Cancer Father   . Cancer Sister     brain  . Breast cancer Sister   . Breast cancer Maternal Aunt   . BRCA 1/2 Son 77    positive  . BRCA 1/2 Daughter 18    negative   Social History  Substance Use Topics  . Smoking status: Former Smoker -- 1.00 packs/day    Types: Cigarettes    Quit date: 11/04/2014  . Smokeless tobacco: Never Used     Comment: stopped smoking 2 wks ago  . Alcohol Use: No   OB History    Gravida Para Term Preterm AB TAB SAB Ectopic Multiple Living   3  2   1  1   2       Obstetric Comments   1st Menstrual Cycle:  11 1st Pregnancy:  17     Review of Systems  All other systems reviewed and are negative.     Allergies  Adhesive; Codeine; Nsaids; Sulfa antibiotics; Amoxicillin; and Penicillins  Home Medications   Prior to Admission medications   Medication Sig Start Date End Date Taking? Authorizing Provider  albuterol (VENTOLIN HFA) 108 (90 BASE) MCG/ACT inhaler Inhale 2 puffs into the lungs every 4 (four) hours as needed for wheezing.    Yes Historical Provider, MD  clonazePAM (KLONOPIN) 0.5 MG tablet Take 0.5 mg by mouth 3 (three) times daily as needed for anxiety.   Yes Historical  Provider, MD  oxyCODONE-acetaminophen (PERCOCET) 10-325 MG tablet TAKE 1 TABLET BY MOUTH EVERY 4 HOURS 11/28/14  Yes Historical Provider, MD  sertraline (ZOLOFT) 100 MG tablet Take 100 mg by mouth daily.   Yes Historical Provider, MD  Tiotropium Bromide Monohydrate (SPIRIVA HANDIHALER IN) Inhale 1 capsule into the lungs daily.    Yes Historical Provider, MD  traZODone (DESYREL) 100 MG tablet Take 100 mg by mouth at bedtime.   Yes Historical Provider, MD   BP 128/69 mmHg  Pulse 80  Temp(Src) 98.1 F (36.7 C) (Oral)  Resp 18  SpO2 95% Physical Exam  Constitutional: She is oriented to person, place, and time. She appears well-developed and well-nourished. No distress.  HENT:  Head: Normocephalic and atraumatic.  Mouth/Throat: Oropharynx is clear and moist. No oropharyngeal exudate.  Eyes: Conjunctivae and EOM are normal. Pupils are equal, round, and reactive to light. Right eye exhibits no discharge. Left eye exhibits no discharge. No scleral icterus.  Neck: Normal range of motion. Neck supple.  No meningismus   Cardiovascular: Normal rate, regular rhythm, normal heart sounds and intact distal pulses.  Exam reveals no gallop and no friction rub.   No murmur heard. Pulmonary/Chest: Effort normal and breath sounds normal. No respiratory distress. She has no wheezes. She has no rales. She exhibits tenderness.  Abdominal: Soft. Bowel sounds are normal. She exhibits no distension and no mass. There is no tenderness. There is no rebound and no guarding.  Musculoskeletal: Normal range of motion. She exhibits no edema or tenderness.  Lymphadenopathy:    She has no cervical adenopathy.  Neurological: She is alert and oriented to person, place, and time. No cranial nerve deficit.  Strength 5/5 throughout. No sensory deficits.   Skin: Skin is warm and dry. No rash noted. She is not diaphoretic. No erythema. No pallor.  Bilateral chest wall incision sites s/p bilateral mastectomy. Incisions CDI. Not  warm to touch. Pt allergic to adhesive, skin pink and itchy around steri strips. No streaking. No drainage. No edema. No fluctuance.   Psychiatric: She has a normal mood and affect. Her behavior is normal.  Nursing note and vitals reviewed.   ED Course  Procedures (including critical care time) Labs Review Labs Reviewed  BASIC METABOLIC PANEL - Abnormal; Notable for the following:    Glucose, Bld 100 (*)    Calcium 8.3 (*)    All other components within normal limits  CBC WITH DIFFERENTIAL/PLATELET - Abnormal; Notable for the following:    RBC 3.40 (*)    Hemoglobin 10.5 (*)    HCT 32.2 (*)    All other components within normal limits    Imaging Review No results found. I have personally reviewed and evaluated these images and lab  results as part of my medical decision-making.   EKG Interpretation None      MDM   Final diagnoses:  Chest wall pain following surgery    Pt with history of polysubstance abuse, recent bilateral mastectomy and tissue expander placement presents with chest wall pain. No clinical sign of infection. Incisions are CDI. Afebrile. VSS. WBC wnl. Do not suspect underlying abscess or seroma. Not fluctuant.   6:32 PM Spoke with Dr. Cristine Polio who performed pt mastectomy as well as placement of tissue expanders. He informed me of pt's history of substance abuse and current pain contract. Dr. Towanda Malkin saw pt Thursday and examined her. He feels confident that she is not having an acute complication and recommends follow up in his office on Monday. Discussed pts WBC is wnl. Vital signs are stable. Dr. Towanda Malkin does not recommend any additional imaging at this time. He reassured me that the expander on the right side will remain higher in the chest wall due to this being the pts dominant side. This is normal.   Discussed treatment plan with pt who is agreeable. Return precautions outlined in patient discharge instructions.   Patient was discussed with  and seen by Dr. Tamera Punt who agrees with the treatment plan.    Dondra Spry [place], PA-C 12/03/14 [id number]  Malvin Johns, MD 12/03/14 2239

## 2014-12-03 NOTE — ED Notes (Signed)
Per EMS pt coming from home with c/o bilateral chest pain, s/p first phase reconstructive breast surgery with tissue extenders placed. Pt sts pain is 10/10  She attempted to call her plastic surgeon multiple times without answer. Surgical site with steri strips bilater. red, warm to touch.

## 2014-12-03 NOTE — Discharge Instructions (Signed)
Pain Relief Preoperatively and Postoperatively °Being a good patient does not mean being a silent one. If you have questions, problems, or concerns about the pain you may feel after surgery, let your caregiver know. Patients have the right to assessment and management of pain. The treatment of pain after surgery is important to speed up recovery and return to normal activities. Severe pain after surgery, and the fear or anxiety associated with that pain, may cause extreme discomfort that: °· Prevents sleep. °· Decreases the ability to breathe deeply and cough. This can cause pneumonia or other upper airway infections. °· Causes your heart to beat faster and your blood pressure to be higher. °· Increases the risk for constipation and bloating. °· Decreases the ability of wounds to heal. °· May result in depression, increased anxiety, and feelings of helplessness. °Relief of pain before surgery is also important because it will lessen the pain after surgery. Patients who receive both pain relief before and after surgery experience greater pain relief than those who only receive pain relief after surgery. Let your caregiver know if you are having uncontrolled pain. This is very important. Pain after surgery is more difficult to manage if it is permitted to become severe, so prompt and adequate treatment of acute pain is necessary. °PAIN CONTROL METHODS °Your caregivers follow policies and procedures about the management of patient pain. These guidelines should be explained to you before surgery. Plans for pain control after surgery must be mutually decided upon and instituted with your full understanding and agreement. Do not be afraid to ask questions regarding the care you are receiving. There are many different ways your caregivers will attempt to control your pain, including the following methods. °As needed pain control °· You may be given pain medicine either through your intravenous (IV) tube, or as a pill or  liquid you can swallow. You will need to let your caregiver know when you are having pain. Then, your caregiver will give you the pain medicine ordered for you. °· Your pain medicine may make you constipated. If constipation occurs, drink more liquids if you can. Your caregiver may have you take a mild laxative. °IV patient-controlled analgesia pump (PCA pump) °· You can get your pain medicine through the IV tube which goes into your vein. You are able to control the amount of pain medicine that you get. The pain medicine flows in through an IV tube and is controlled by a pump. This pump gives you a set amount of pain medicine when you push the button hooked up to it. Nobody should push this button but you or someone specifically assigned by you to do so. It is set up to keep you from accidentally giving yourself too much pain medicine. You will be able to start using your pain pump in the recovery room after your surgery. This method can be helpful for most types of surgery. °· If you are still having too much pain, tell your caregiver. Also, tell your caregiver if you are feeling too sleepy or nauseous. °Continuous epidural pain control °· A thin, soft tube (catheter) is put into your back. Pain medicine flows through the catheter to lessen pain in the part of your body where the surgery is done. Continuous epidural pain control may work best for you if you are having surgery on your chest, abdomen, hip area, or legs. The epidural catheter is usually put into your back just before surgery. The catheter is left in until you can eat and take medicine by mouth. In most cases,   this may take 2 to 3 days.  Giving pain medicine through the epidural catheter may help you heal faster because:  Your bowel gets back to normal faster.  You can get back to eating sooner.  You can be up and walking sooner. Medicine that numbs the area (local anesthetic)  You may receive an injection of pain medicine near where the  pain is (local infiltration).  You may receive an injection of pain medicine near the nerve that controls the sensation to a specific part of the body (peripheral nerve block).  Medicine may be put in the spine to block pain (spinal block). Opioids  Moderate to moderately severe acute pain after surgery may respond to opioids.Opioids are narcotic pain medicine. Opioids are often combined with non-narcotic medicines to improve pain relief, diminish the risk of side effects, and reduce the chance of addiction.  If you follow your caregiver's directions about taking opioids and you do not have a history of substance abuse, your risk of becoming addicted is exceptionally small.Opioids are given for short periods of time in careful doses to prevent addiction. Other methods of pain control include:  Steroids.  Physical therapy.  Heat and cold therapy.  Compression, such as wrapping an elastic bandage around the area of pain.  Massage. These various ways of controlling pain may be used together. Combining different methods of pain control is called multimodal analgesia. Using this approach has many benefits, including being able to eat, move around, and leave the hospital sooner. Document Released: 05/11/2002 Document Revised: 05/13/2011 Document Reviewed: 05/15/2010 [facility] Patient Information 2015 [place], [place]. This information is not intended to replace advice given to you by your health care provider. Make sure you discuss any questions you have with your health care provider.  Follow up with Dr. Towanda Malkin Monday. Take home percocet as prescribed as needed for pain. Return to the ED if symptoms worsen or if you experience fever, chills, vomiting, draining from surgical site.

## 2014-12-03 NOTE — ED Notes (Signed)
Pt requesting to have PIV placed to right hand, sts that's where she had it for the surgery and was told by her doctor it was okay to use her hand for IV

## 2015-03-28 ENCOUNTER — Encounter (HOSPITAL_BASED_OUTPATIENT_CLINIC_OR_DEPARTMENT_OTHER): Payer: Self-pay | Admitting: *Deleted

## 2015-03-29 ENCOUNTER — Emergency Department (HOSPITAL_COMMUNITY)
Admission: EM | Admit: 2015-03-29 | Discharge: 2015-03-29 | Disposition: A | Discharge status: Home / Self Care | Payer: Medicaid Other | Attending: Emergency Medicine | Admitting: Emergency Medicine

## 2015-03-29 ENCOUNTER — Encounter (HOSPITAL_COMMUNITY): Payer: Self-pay | Admitting: Emergency Medicine

## 2015-03-29 ENCOUNTER — Emergency Department (HOSPITAL_COMMUNITY): Payer: Medicaid Other

## 2015-03-29 DIAGNOSIS — R197 Diarrhea, unspecified: Secondary | ICD-10-CM | POA: Diagnosis not present

## 2015-03-29 DIAGNOSIS — J439 Emphysema, unspecified: Secondary | ICD-10-CM | POA: Diagnosis not present

## 2015-03-29 DIAGNOSIS — Z880 Allergy status to penicillin: Secondary | ICD-10-CM | POA: Diagnosis not present

## 2015-03-29 DIAGNOSIS — K625 Hemorrhage of anus and rectum: Secondary | ICD-10-CM | POA: Insufficient documentation

## 2015-03-29 DIAGNOSIS — Z90710 Acquired absence of both cervix and uterus: Secondary | ICD-10-CM | POA: Diagnosis not present

## 2015-03-29 DIAGNOSIS — Z8742 Personal history of other diseases of the female genital tract: Secondary | ICD-10-CM | POA: Insufficient documentation

## 2015-03-29 DIAGNOSIS — R0789 Other chest pain: Secondary | ICD-10-CM | POA: Diagnosis not present

## 2015-03-29 DIAGNOSIS — F419 Anxiety disorder, unspecified: Secondary | ICD-10-CM | POA: Diagnosis not present

## 2015-03-29 DIAGNOSIS — F319 Bipolar disorder, unspecified: Secondary | ICD-10-CM | POA: Insufficient documentation

## 2015-03-29 DIAGNOSIS — Z8739 Personal history of other diseases of the musculoskeletal system and connective tissue: Secondary | ICD-10-CM | POA: Insufficient documentation

## 2015-03-29 DIAGNOSIS — R112 Nausea with vomiting, unspecified: Secondary | ICD-10-CM | POA: Diagnosis not present

## 2015-03-29 DIAGNOSIS — Z85840 Personal history of malignant neoplasm of eye: Secondary | ICD-10-CM | POA: Insufficient documentation

## 2015-03-29 DIAGNOSIS — Z8541 Personal history of malignant neoplasm of cervix uteri: Secondary | ICD-10-CM | POA: Insufficient documentation

## 2015-03-29 DIAGNOSIS — R062 Wheezing: Secondary | ICD-10-CM | POA: Diagnosis present

## 2015-03-29 DIAGNOSIS — Z79899 Other long term (current) drug therapy: Secondary | ICD-10-CM | POA: Diagnosis not present

## 2015-03-29 DIAGNOSIS — F17210 Nicotine dependence, cigarettes, uncomplicated: Secondary | ICD-10-CM | POA: Diagnosis not present

## 2015-03-29 DIAGNOSIS — E876 Hypokalemia: Secondary | ICD-10-CM | POA: Insufficient documentation

## 2015-03-29 DIAGNOSIS — G8929 Other chronic pain: Secondary | ICD-10-CM | POA: Insufficient documentation

## 2015-03-29 DIAGNOSIS — R0602 Shortness of breath: Secondary | ICD-10-CM

## 2015-03-29 LAB — I-STAT CHEM 8, ED
BUN: 12 mg/dL (ref 6–20)
CHLORIDE: 105 mmol/L (ref 101–111)
CREATININE: 0.7 mg/dL (ref 0.44–1.00)
Calcium, Ion: 1.13 mmol/L (ref 1.12–1.23)
Glucose, Bld: 100 mg/dL — ABNORMAL HIGH (ref 65–99)
HEMATOCRIT: 42 % (ref 36.0–46.0)
Hemoglobin: 14.3 g/dL (ref 12.0–15.0)
POTASSIUM: 3.7 mmol/L (ref 3.5–5.1)
SODIUM: 142 mmol/L (ref 135–145)
TCO2: 22 mmol/L (ref 0–100)

## 2015-03-29 LAB — BASIC METABOLIC PANEL
ANION GAP: 11 (ref 5–15)
BUN: 9 mg/dL (ref 6–20)
CO2: 24 mmol/L (ref 22–32)
Calcium: 9.6 mg/dL (ref 8.9–10.3)
Chloride: 105 mmol/L (ref 101–111)
Creatinine, Ser: 0.83 mg/dL (ref 0.44–1.00)
GFR calc Af Amer: >60 mL/min (ref >60)
GLUCOSE: 94 mg/dL (ref 65–99)
POTASSIUM: 2.6 mmol/L — AB (ref 3.5–5.1)
Sodium: 140 mmol/L (ref 135–145)

## 2015-03-29 LAB — CBC
HEMATOCRIT: 46.8 % — AB (ref 36.0–46.0)
HEMOGLOBIN: 15.4 g/dL — AB (ref 12.0–15.0)
MCH: 29 pg (ref 26.0–34.0)
MCHC: 32.9 g/dL (ref 30.0–36.0)
MCV: 88.1 fL (ref 78.0–100.0)
PLATELETS: 208 10*3/uL (ref 150–400)
RBC: 5.31 MIL/uL — AB (ref 3.87–5.11)
RDW: 14.6 % (ref 11.5–15.5)
WBC: 6.5 10*3/uL (ref 4.0–10.5)

## 2015-03-29 LAB — I-STAT TROPONIN, ED: Troponin i, poc: 0.01 ng/mL (ref 0.00–0.08)

## 2015-03-29 LAB — POC OCCULT BLOOD, ED: FECAL OCCULT BLD: NEGATIVE

## 2015-03-29 MED ORDER — POTASSIUM CHLORIDE CRYS ER 20 MEQ PO TBCR
40.0000 meq | EXTENDED_RELEASE_TABLET | Freq: Once | ORAL | Status: AC
  Administered 2015-03-29: 40 meq via ORAL
  Filled 2015-03-29: qty 2

## 2015-03-29 MED ORDER — ONDANSETRON HCL 4 MG/2ML IJ SOLN
4.0000 mg | Freq: Once | INTRAMUSCULAR | Status: AC
  Administered 2015-03-29: 4 mg via INTRAVENOUS
  Filled 2015-03-29: qty 2

## 2015-03-29 MED ORDER — SODIUM CHLORIDE 0.9 % IV BOLUS (SEPSIS)
1000.0000 mL | Freq: Once | INTRAVENOUS | Status: AC
  Administered 2015-03-29: 1000 mL via INTRAVENOUS

## 2015-03-29 MED ORDER — AZITHROMYCIN 250 MG PO TABS: ORAL_TABLET | ORAL | Status: DC

## 2015-03-29 MED ORDER — BENZONATATE 100 MG PO CAPS: 100.0000 mg | ORAL_CAPSULE | Freq: Three times a day (TID) | ORAL | Status: DC

## 2015-03-29 MED ORDER — FENTANYL CITRATE (PF) 100 MCG/2ML IJ SOLN
50.0000 ug | Freq: Once | INTRAMUSCULAR | Status: AC
  Administered 2015-03-29: 50 ug via INTRAVENOUS
  Filled 2015-03-29: qty 2

## 2015-03-29 MED ORDER — POTASSIUM CHLORIDE 10 MEQ/100ML IV SOLN
10.0000 meq | INTRAVENOUS | Status: AC
  Administered 2015-03-29 (×2): 10 meq via INTRAVENOUS
  Filled 2015-03-29 (×2): qty 100

## 2015-03-29 MED ORDER — ONDANSETRON 4 MG PO TBDP: 4.0000 mg | ORAL_TABLET | Freq: Three times a day (TID) | ORAL | Status: DC | PRN

## 2015-03-29 MED ORDER — PREDNISONE 20 MG PO TABS: 40.0000 mg | ORAL_TABLET | Freq: Every day | ORAL | Status: DC

## 2015-03-29 MED ORDER — DIPHENHYDRAMINE HCL 50 MG/ML IJ SOLN: 25.0000 mg | Freq: Once | INTRAMUSCULAR | Status: DC | PRN

## 2015-03-29 MED ORDER — SODIUM CHLORIDE 0.9 % IV SOLN
INTRAVENOUS | Status: DC
  Administered 2015-03-29: 14:00:00 via INTRAVENOUS

## 2015-03-29 MED ORDER — OXYCODONE-ACETAMINOPHEN 5-325 MG PO TABS
1.0000 | ORAL_TABLET | Freq: Once | ORAL | Status: AC
  Administered 2015-03-29: 1 via ORAL
  Filled 2015-03-29: qty 1

## 2015-03-29 MED ORDER — POTASSIUM CHLORIDE ER 10 MEQ PO TBCR: 10.0000 meq | EXTENDED_RELEASE_TABLET | Freq: Two times a day (BID) | ORAL | Status: DC

## 2015-03-29 MED ORDER — MORPHINE SULFATE (PF) 4 MG/ML IV SOLN
4.0000 mg | Freq: Once | INTRAVENOUS | Status: AC
  Administered 2015-03-29: 4 mg via INTRAVENOUS
  Filled 2015-03-29: qty 1

## 2015-03-29 MED ORDER — POTASSIUM CHLORIDE 10 MEQ/100ML IV SOLN
10.0000 meq | Freq: Once | INTRAVENOUS | Status: AC
  Administered 2015-03-29: 10 meq via INTRAVENOUS
  Filled 2015-03-29: qty 100

## 2015-03-29 MED ORDER — ALBUTEROL (5 MG/ML) CONTINUOUS INHALATION SOLN
10.0000 mg/h | INHALATION_SOLUTION | Freq: Once | RESPIRATORY_TRACT | Status: AC
  Administered 2015-03-29: 10 mg/h via RESPIRATORY_TRACT
  Filled 2015-03-29: qty 20

## 2015-03-29 NOTE — ED Provider Notes (Signed)
Care assumed at 4pm  70 y f w a few days of cough/sob.  Patients sob improved with albuterol.    k 2.6.  patientis getting 3 runs of K.  Patient is feeling better from resp standpoint. After infusions finish, plan to re-check K and d/c home.  Repeat K normal.  Pt continues to feel well with improved wob.  At this point feel safe for d/c  I have discussed the results, Dx and Tx plan with the pt. They expressed understanding and agree with the plan and were told to return to ED with any worsening of condition or concern.    Disposition: Discharge  Condition: Good  Discharge Medication List as of 03/29/2015  3:29 PM    START taking these medications   Details  azithromycin (ZITHROMAX) 250 MG tablet Take 2 pills on the first day. Take one pill per day starting on day 2., Print    benzonatate (TESSALON) 100 MG capsule Take 1 capsule (100 mg total) by mouth every 8 (eight) hours., Starting 03/29/2015, Until Discontinued, Print    ondansetron (ZOFRAN ODT) 4 MG disintegrating tablet Take 1 tablet (4 mg total) by mouth every 8 (eight) hours as needed for nausea or vomiting., Starting 03/29/2015, Until Discontinued, Print    potassium chloride (K-DUR) 10 MEQ tablet Take 1 tablet (10 mEq total) by mouth 2 (two) times daily., Starting 03/29/2015, Until Discontinued, Print    predniSONE (DELTASONE) 20 MG tablet Take 2 tablets (40 mg total) by mouth daily., Starting 03/29/2015, Until Discontinued, Print        Follow Up: Nat Christen, PA-C [place] [address] [phone]  In 1 day For follow-up and lab recheck of potassium  [place] [address] [id number] [place] [phone]  As needed, If symptoms worsen   Pt seen in conjunction with Dr. Herminio Heads, MD 03/30/15 [id number]  Leo Grosser, MD 03/31/15 [phone]

## 2015-03-29 NOTE — ED Notes (Signed)
Patient stated she could not take off her bra because of recent surgery

## 2015-03-29 NOTE — Discharge Instructions (Signed)
You have been seen today for shortness of breath and pain with your breast expander. Your imaging and lab tests showed no abnormalities. Follow up with your plastic surgeon as scheduled on Monday. Go to your PCP in 24-48 hours to have your potassium rechecked. Return to ED should symptoms worsen. Tylenol or ibuprofen for pain. Take the Potassium Chloride for the next three days, as directed.

## 2015-03-29 NOTE — ED Provider Notes (Signed)
CSN: [id number]     Arrival date & time 03/29/15  1046 History   First MD Initiated Contact with Patient 03/29/15 1054     Chief Complaint  Patient presents with  . Rectal Bleeding  . Wheezing     (Consider location/radiation/quality/duration/timing/severity/associated sxs/prior Treatment) HPI   Shelby Wang is a 50 y.o. female, with a history of asthma, COPD emphysema, anxiety, chronic pain, and bipolar, presenting to the ED with complaint of shortness of breath, productive cough with green sputum, and rectal bleeding for the last 3 days. Patient also complains of nausea, vomiting, and diarrhea since yesterday as well as chest pain for the last week. Reports 3 episodes of vomiting and 3 episodes of diarrhea over the past 24 hours. Patient rates her chest pain at 9 out of 10, stabbing in nature, located in the right chest, nonradiating.  6 months ago patient had a double mastectomy with implants expanders placed at that time. Patient states, "I think my expander exploded" and she thinks this may be the cause of her chest discomfort. Patient states that she thinks that her "rectal bleeding" was just due to her previously diagnosed hemorrhoids and adds that she is not very worried about this complaint. Patient previously received 10 mg of albuterol and 0.5 mg of Atrovent with EMS prior to arrival. Patient denies fever/chills, abdominal pain, urinary complaints, or any other pain or complaints. Patient adds that she has not been taking her Spiriva and has only been taking her albuterol. Patient continues to smoke tobacco at about a third of a pack a day.    Past Medical History  Diagnosis Date  . Herniated disc   . Asthma   . Arthritis   . Urethral diverticulum   . Cancer [facility])     cervical cancer s/p hysterectomy  . Cervical cancer ([place])   . Atrophic vaginitis   . Seizures ([place])   . Bipolar disorder ([place])   . COPD (chronic obstructive pulmonary disease) ([place])   . Emphysema/COPD ([place])    . Eye cancer [facility]) 2013    left eye  . Chronic pain     goes to pain clinic  . Anxiety   . Chronic pain     goes to pain clinic for oxycodone   Past Surgical History  Procedure Laterality Date  . Cesarean section    . Total abdominal hysterectomy w/ bilateral salpingoophorectomy    . Vaginal prolapse repair  2012  . Tooth extraction  06-09-14  . Abdominal hysterectomy      cervical cancer age 69  . Breast biopsy Right 11-16-13    BRCA II positive  . Mastectomy Bilateral 06-23-14  . Breast reconstruction Bilateral 11/21/2014    Procedure: BILATERAL BREAST RECONSTRUCTION WITH PLACEMENT OF TISSUE EXPANDER;  Surgeon: Cristine Polio, MD;  Location: [place];  Service: Plastics;  Laterality: Bilateral;  . Tissue expander placement Bilateral 11/21/2014    Procedure: TISSUE EXPANDER;  Surgeon: Cristine Polio, MD;  Location: [place];  Service: Plastics;  Laterality: Bilateral;   Family History  Problem Relation Age of Onset  . Cancer Mother     ovarian, breast  . Heart disease Mother   . Cancer Father   . Cancer Sister     brain  . Breast cancer Sister   . Breast cancer Maternal Aunt   . BRCA 1/2 Son 79    positive  . BRCA 1/2 Daughter 54    negative   Social History  Substance Use Topics  . Smoking status: Current Every Day Smoker -- 1.00 packs/day    Types: Cigarettes  . Smokeless tobacco: Never Used  . Alcohol Use: No   OB History    Gravida Para Term Preterm AB TAB SAB Ectopic Multiple Living   _0 Obstetric Comments   1st Menstrual Cycle:  11 1st Pregnancy:  17     Review of Systems  Constitutional: Negative for fever, chills and diaphoresis.  Respiratory: Positive for cough and shortness of breath.   Cardiovascular: Positive for chest pain.  Gastrointestinal: Negative for nausea, vomiting, abdominal pain, diarrhea and constipation.  Genitourinary: Negative for dysuria and hematuria.  Skin: Negative for color  change and pallor.  Neurological: Negative for dizziness, syncope, light-headedness and headaches.  All other systems reviewed and are negative.     Allergies  Adhesive; Codeine; Nsaids; Sulfa antibiotics; Amoxicillin; and Penicillins  Home Medications   Prior to Admission medications   Medication Sig Start Date End Date Taking? Authorizing Provider  albuterol (VENTOLIN HFA) 108 (90 BASE) MCG/ACT inhaler Inhale 2 puffs into the lungs every 4 (four) hours as needed for wheezing.    Yes Historical Provider, MD  clonazePAM (KLONOPIN) 0.5 MG tablet Take 0.5 mg by mouth 3 (three) times daily as needed for anxiety.   Yes Historical Provider, MD  oxyCODONE-acetaminophen (PERCOCET) 10-325 MG tablet TAKE 1 TABLET BY MOUTH EVERY 4 HOURS 11/28/14  Yes Historical Provider, MD  sertraline (ZOLOFT) 100 MG tablet Take 100 mg by mouth daily.   Yes Historical Provider, MD  Tiotropium Bromide Monohydrate (SPIRIVA HANDIHALER IN) Inhale 1 capsule into the lungs daily.    Yes Historical Provider, MD  traZODone (DESYREL) 100 MG tablet Take 100 mg by mouth at bedtime.   Yes Historical Provider, MD  azithromycin (ZITHROMAX) 250 MG tablet Take 2 pills on the first day. Take one pill per day starting on day 2. 03/29/15   Tymir Terral C Milta Croson, PA-C  benzonatate (TESSALON) 100 MG capsule Take 1 capsule (100 mg total) by mouth every 8 (eight) hours. 03/29/15   Davonn Flanery C Timothey Dahlstrom, PA-C  ondansetron (ZOFRAN ODT) 4 MG disintegrating tablet Take 1 tablet (4 mg total) by mouth every 8 (eight) hours as needed for nausea or vomiting. 03/29/15   Rajat Staver C Ayvin Lipinski, PA-C  potassium chloride (K-DUR) 10 MEQ tablet Take 1 tablet (10 mEq total) by mouth 2 (two) times daily. 03/29/15   Lilliann Rossetti C Lydell Moga, PA-C  predniSONE (DELTASONE) 20 MG tablet Take 2 tablets (40 mg total) by mouth daily. 03/29/15   Valincia Touch C Christifer Chapdelaine, PA-C   BP 97/52 mmHg  Pulse 85  Temp(Src) 98.8 F (37.1 C) (Oral)  Resp 23  SpO2 97% Physical Exam  Constitutional: She appears well-developed and  well-nourished. No distress.  HENT:  Head: Normocephalic and atraumatic.  Eyes: Conjunctivae are normal. Pupils are equal, round, and reactive to light.  Neck: Normal range of motion. Neck supple.  Cardiovascular: Normal rate, regular rhythm, normal heart sounds and intact distal pulses.   Pulmonary/Chest: Effort normal. No tachypnea. No respiratory distress. She has wheezes in the right upper field, the right middle field, the right lower field, the left upper field, the left middle field and the left lower field. She has rhonchi in the right lower field and the left lower field.  Increased work of breathing, but speaks in full sentences. Tenderness to the area around the right expander. Pain is  fully reproducible on palpation. Both expanders appear to be filled to approximately the same level with no deflation noted. Expander does not appear to be movable.  Abdominal: Soft. Bowel sounds are normal. There is no tenderness.  Genitourinary: Guaiac negative stool.  No stool or gross blood noted on rectal exam. RN, Levada Dy, served as chaperone during the exam.  Musculoskeletal: She exhibits no edema or tenderness.  Lymphadenopathy:    She has no cervical adenopathy.  Neurological: She is alert.  Skin: Skin is warm and dry. She is not diaphoretic.  Nursing note and vitals reviewed.   ED Course  Procedures (including critical care time) Labs Review Labs Reviewed  CBC - Abnormal; Notable for the following:    RBC 5.31 (*)    Hemoglobin 15.4 (*)    HCT 46.8 (*)    All other components within normal limits  BASIC METABOLIC PANEL - Abnormal; Notable for the following:    Potassium 2.6 (*)    All other components within normal limits  POC OCCULT BLOOD, ED  Randolm Idol, ED    Imaging Review Dg Chest 2 View  03/29/2015  CLINICAL DATA:  Cough and wheezing for 4 days. Cough and wheezing for 4 days. EXAM: CHEST  2 VIEW COMPARISON:  Chest x-ray 07/24/2014 and CT scan, same date. FINDINGS:  The cardiac silhouette, mediastinal and hilar contours are within normal limits and stable. Stable dense sub carinal calcified lymph nodes. The lungs demonstrate stable emphysematous changes and hyperinflation. No acute overlying pulmonary process. No pleural effusion. IMPRESSION: Stable emphysematous changes but no acute pulmonary findings. Electronically Signed   By: Marijo Sanes M.D.   On: 03/29/2015 12:12   I have personally reviewed and evaluated these images and lab results as part of my medical decision-making.   EKG Interpretation   Date/Time:  Wednesday March 29 2015 10:54:34 EST Ventricular Rate:  98 PR Interval:  125 QRS Duration: 85 QT Interval:  366 QTC Calculation: 467 R Axis:   78 Text Interpretation:  Sinus rhythm Borderline T abnormalities, anterior  leads no significant change since 2015 Confirmed by GOLDSTON  MD, SCOTT  ([id number]) on 03/29/2015 2:49:31 PM      MDM   Final diagnoses:  Shortness of breath  Chest wall pain  Non-intractable vomiting with nausea, vomiting of unspecified type  Diarrhea, unspecified type    Shelby Wang presents with shortness of breath, nausea, vomiting, and diarrhea for the past 3 days. As well as chest wall pain for the last week.  Findings and plan of care discussed with Sherwood Gambler, MD.  Since this patient already received 10 mg of albuterol and 0.5 mg of Atrovent, she will be started on a continuous nebulizer. As far as the patient's chest discomfort, it would be unlikely without history of trauma that the patient's expander would have ruptured. Even if a rupture occurred, patient is scheduled to see her plastic surgeon for a follow-up on January 30. Patient has no signs of rectal bleeding and no signs of anemia. 1:15 PM patient states that her shortness of breath is improved as well as her pain. Her potassium was noted to be 2.6. The patient's drop below normal was probably due to the administration of albuterol. Patient's  potassium still low enough that it will need to be repleted. Patient's breathing has significantly improved and the patient states that she is back to her normal baseline level. The albuterol was discontinued. KCl infusions were ordered. The pharmacy recommends at least 3 doses  of 10 mEq each of KCl IV with oral therapy thereafter. Patient to follow up with her PCP for lab redraw in 24-48 hours. 3:44 PM End of shift patient care handoff report given to Jule Economy, MD. Plan is for patient to finish KCl infusions, retest potassium, and then discharge.  Filed Vitals:   03/29/15 1300 03/29/15 1330 03/29/15 1400 03/29/15 1430  BP: 120/73 109/57 105/64 97/52  Pulse: 92 100 84 85  Temp:      TempSrc:      Resp:  _0 SpO2: 100% 96% 97% 97%     Lorayne Bender, PA-C 03/29/15 [place], MD 03/30/15 1027

## 2015-03-29 NOTE — Progress Notes (Signed)
CSW engaged with Patient at Patient's bedside re: possible homelessness. CSW provided supportive counseling and emotional support. Patient reports that she lives with two elderly women and that when she came to the hospital, they took her belongings and moved them to Patient's daughter's house. Patient reports that they did not return her dog and identified her dog as being a major support in her life. Patient reports that she is unable to stay at her daughter's home due to her son-in-law not allowing her to stay. CSW inquired about other family Patient could stay with as well the idea of her daughter assisting her with staying in a hotel temporarily until other arrangements could be made. Patient informed CSW that her daughter had called their [employer] who was sending someone to the hospital to assist with housing. Patient was appreciative of CSW's visit and denies any further concerns at this time. CSW signing off as social work intervention has been completed. Please contact if new need(s) arise.   Isac Sarna [facility] ED/ [place] Social Worker [phone]

## 2015-03-29 NOTE — ED Notes (Signed)
Pt from home via [facility] with c/o rectal bleeding x 3 days with no hx of hemorrhoids.  On arrival pt had severe wheezing in all lung fields with SOB x "a couple of days."  Pt reports productive green cough with pink tinge.  Pt additionally reports having right chest pain s/o expander implants, which she states feels like it "burst."  Given 10 mg albuterol and 0.5 mg Atrovent.  Hx COPD, breast cancer, and HTN.  NAD, A&O.

## 2015-04-03 ENCOUNTER — Ambulatory Visit (HOSPITAL_BASED_OUTPATIENT_CLINIC_OR_DEPARTMENT_OTHER): Admission: RE | Admit: 2015-04-03 | Payer: Medicaid Other | Source: Ambulatory Visit | Admitting: Specialist

## 2015-04-03 SURGERY — BREAST RECONSTRUCTION WITH PLACEMENT OF TISSUE EXPANDER AND FLEX HD (ACELLULAR HYDRATED DERMIS)
Anesthesia: General | Site: Breast | Laterality: Bilateral

## 2015-08-29 ENCOUNTER — Inpatient Hospital Stay: Payer: Medicaid Other | Attending: Oncology | Admitting: Oncology

## 2015-10-11 DIAGNOSIS — Z79891 Long term (current) use of opiate analgesic: Secondary | ICD-10-CM | POA: Insufficient documentation

## 2015-10-11 DIAGNOSIS — F1190 Opioid use, unspecified, uncomplicated: Secondary | ICD-10-CM | POA: Insufficient documentation

## 2015-10-11 DIAGNOSIS — Z5181 Encounter for therapeutic drug level monitoring: Secondary | ICD-10-CM | POA: Insufficient documentation

## 2015-10-11 DIAGNOSIS — G8929 Other chronic pain: Secondary | ICD-10-CM | POA: Insufficient documentation

## 2015-10-11 DIAGNOSIS — Z0189 Encounter for other specified special examinations: Secondary | ICD-10-CM | POA: Insufficient documentation

## 2015-10-11 NOTE — Progress Notes (Signed)
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   Lab-work & Procedure Ordered: Orders Placed This Encounter  Procedures  . MR Lumbar Spine Wo Contrast  . Compliance Drug Analysis, Ur  . Comprehensive metabolic panel  . C-reactive protein  . Magnesium  . Sedimentation rate  . Vitamin B12  . 25-Hydroxyvitamin D Lcms D2+D3  . Ambulatory referral to Psychology    Interventional Therapies: Scheduled:  None at this time.    Considering:   Diagnostic left sided intercostal lumbar blocks. Possible intercostal nerve radiofrequency ablation.   PRN Procedures:  None at this time.    Referral(s) or Consult(s): Medical psychology consult for substance use disorder evaluation  Medications administered during this visit: Ms. Fontes had no medications administered during this visit.  Prescriptions ordered during this visit: New Prescriptions   No medications on file    Requested PM Follow-up: Return for After [facility] Eval.  No future  appointments.   Primary Care Physician: Nat Christen, PA-C Location: [facility] Note by: Beatriz Chancellor A. Dossie Arbour, M.D, DABA, DABAPM, DABPM, DABIPP, FIPP  Pain Score Disclaimer: We use the NRS-11 scale. This is a self-reported, subjective measurement of pain severity with only modest accuracy. It is used primarily to identify changes within a particular patient. It must be understood that outpatient pain scales are significantly less accurate that those used for research, where they can be applied under ideal controlled circumstances with minimal exposure to variables. In reality, the score is likely to be a combination of pain intensity and pain affect, where pain affect describes the degree of emotional arousal or changes in action readiness caused by the sensory experience of pain. Factors such as social and work situation, setting, emotional state, anxiety levels, expectation, and prior pain  experience may influence pain perception and show large inter-individual differences that may also be affected by time variables.  Patient instructions provided during this appointment: Patient Instructions  INSTRUCTED TO GO GET LABWORK DRAWN AND XRAYS

## 2015-10-12 ENCOUNTER — Telehealth: Payer: Self-pay

## 2015-10-12 ENCOUNTER — Other Ambulatory Visit
Admission: RE | Admit: 2015-10-12 | Discharge: 2015-10-12 | Disposition: A | Discharge status: Home / Self Care | Payer: Medicaid Other | Source: Ambulatory Visit | Attending: Pain Medicine | Admitting: Pain Medicine

## 2015-10-12 ENCOUNTER — Ambulatory Visit (HOSPITAL_BASED_OUTPATIENT_CLINIC_OR_DEPARTMENT_OTHER): Payer: Medicaid Other | Admitting: Pain Medicine

## 2015-10-12 ENCOUNTER — Encounter: Payer: Self-pay | Admitting: Pain Medicine

## 2015-10-12 ENCOUNTER — Encounter (INDEPENDENT_AMBULATORY_CARE_PROVIDER_SITE_OTHER): Payer: Self-pay

## 2015-10-12 DIAGNOSIS — Z5181 Encounter for therapeutic drug level monitoring: Secondary | ICD-10-CM

## 2015-10-12 DIAGNOSIS — G8929 Other chronic pain: Secondary | ICD-10-CM | POA: Insufficient documentation

## 2015-10-12 DIAGNOSIS — M549 Dorsalgia, unspecified: Secondary | ICD-10-CM

## 2015-10-12 DIAGNOSIS — Z79891 Long term (current) use of opiate analgesic: Secondary | ICD-10-CM

## 2015-10-12 DIAGNOSIS — M545 Low back pain, unspecified: Secondary | ICD-10-CM

## 2015-10-12 DIAGNOSIS — M79643 Pain in unspecified hand: Secondary | ICD-10-CM

## 2015-10-12 DIAGNOSIS — Z79899 Other long term (current) drug therapy: Secondary | ICD-10-CM | POA: Diagnosis not present

## 2015-10-12 DIAGNOSIS — Z0189 Encounter for other specified special examinations: Secondary | ICD-10-CM

## 2015-10-12 DIAGNOSIS — G893 Neoplasm related pain (acute) (chronic): Secondary | ICD-10-CM | POA: Insufficient documentation

## 2015-10-12 DIAGNOSIS — M5416 Radiculopathy, lumbar region: Secondary | ICD-10-CM

## 2015-10-12 DIAGNOSIS — N644 Mastodynia: Secondary | ICD-10-CM

## 2015-10-12 DIAGNOSIS — M546 Pain in thoracic spine: Secondary | ICD-10-CM

## 2015-10-12 DIAGNOSIS — M79605 Pain in left leg: Secondary | ICD-10-CM

## 2015-10-12 DIAGNOSIS — F1190 Opioid use, unspecified, uncomplicated: Secondary | ICD-10-CM

## 2015-10-12 LAB — SEDIMENTATION RATE: Sed Rate: 10 mm/hr (ref 0–30)

## 2015-10-12 LAB — COMPREHENSIVE METABOLIC PANEL
ALBUMIN: 4.2 g/dL (ref 3.5–5.0)
ALK PHOS: 124 U/L (ref 38–126)
ALT: 15 U/L (ref 14–54)
ANION GAP: 8 (ref 5–15)
AST: 21 U/L (ref 15–41)
BUN: 19 mg/dL (ref 6–20)
CALCIUM: 8.9 mg/dL (ref 8.9–10.3)
CO2: 24 mmol/L (ref 22–32)
CREATININE: 0.82 mg/dL (ref 0.44–1.00)
Chloride: 107 mmol/L (ref 101–111)
GFR calc Af Amer: >60 mL/min (ref >60)
GFR calc non Af Amer: >60 mL/min (ref >60)
GLUCOSE: 123 mg/dL — AB (ref 65–99)
Potassium: 3.6 mmol/L (ref 3.5–5.1)
SODIUM: 139 mmol/L (ref 135–145)
Total Bilirubin: 0.5 mg/dL (ref 0.3–1.2)
Total Protein: 7.4 g/dL (ref 6.5–8.1)

## 2015-10-12 LAB — MAGNESIUM: Magnesium: 2 mg/dL (ref 1.7–2.4)

## 2015-10-12 LAB — C-REACTIVE PROTEIN: CRP: 0.6 mg/dL (ref <1.0)

## 2015-10-12 LAB — VITAMIN B12: Vitamin B-12: 394 pg/mL (ref 180–914)

## 2015-10-12 NOTE — Progress Notes (Signed)
Safety precautions to be maintained throughout the outpatient stay will include: orient to surroundings, keep bed in low position, maintain call bell within reach at all times, provide assistance with transfer out of bed and ambulation.  Had oral surgery 10-11-15 by [facility] in [place] and was given Percocet 5/325 mg #5

## 2015-10-12 NOTE — Telephone Encounter (Signed)
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 Dossie Arbour again. They do not want to continue doing this but feels like the pt does need the medications. I just wanted everyone to be aware of whats going on with the patient.

## 2015-10-12 NOTE — Telephone Encounter (Signed)
Spoke with S[place]oya to clarify note. Physician at [facility] will prescribe meds, they are not asking if it is ok. Dr. Dossie Arbour currently does not prescribe pain meds.

## 2015-10-12 NOTE — Patient Instructions (Signed)
INSTRUCTED TO GO GET LABWORK DRAWN AND XRAYS

## 2015-10-16 LAB — 25-HYDROXYVITAMIN D LCMS D2+D3
25-HYDROXY, VITAMIN D-3: 35 ng/mL
25-HYDROXY, VITAMIN D: 35 ng/mL

## 2015-10-16 LAB — 25-HYDROXY VITAMIN D LCMS D2+D3: 25-Hydroxy, Vitamin D-2: <1 ng/mL

## 2015-10-20 LAB — COMPLIANCE DRUG ANALYSIS, UR: PDF: 0

## 2015-11-07 ENCOUNTER — Telehealth: Payer: Self-pay

## 2015-11-07 NOTE — Telephone Encounter (Signed)
[facility] left a vm asking if they needed to continue her meds until the med pysch eval was done. I tried calling them back, there was no answer

## 2015-11-12 NOTE — Progress Notes (Signed)
Normal fasting (NPO x 8 hours) glucose levels are between 65-99 mg/dl, with 2 hour fasting, levels are usually less than 140 mg/dl. Any random blood glucose level greater than 200 mg/dl is considered to be Diabetes.

## 2016-02-14 ENCOUNTER — Inpatient Hospital Stay: Payer: Medicaid Other | Admitting: Hematology and Oncology

## 2017-05-20 ENCOUNTER — Other Ambulatory Visit: Payer: Self-pay | Admitting: Physician Assistant

## 2017-05-20 DIAGNOSIS — M5416 Radiculopathy, lumbar region: Secondary | ICD-10-CM

## 2017-05-26 ENCOUNTER — Inpatient Hospital Stay
Admission: RE | Admit: 2017-05-26 | Discharge: 2017-05-26 | Disposition: A | Discharge status: Home / Self Care | Payer: Self-pay | Source: Ambulatory Visit | Attending: Physician Assistant | Admitting: Physician Assistant

## 2017-12-16 ENCOUNTER — Encounter: Payer: Self-pay | Admitting: Neurology

## 2018-03-13 ENCOUNTER — Encounter

## 2018-03-13 ENCOUNTER — Ambulatory Visit: Payer: Self-pay | Admitting: Neurology

## 2018-03-13 NOTE — Progress Notes (Deleted)
[facility] Clinic Note - Initial Visit   Date: 03/13/18  Shelby Wang MRN: [id number] DOB: Nov 25, 1965   Dear Dr Shelby KitchenNat Christen, PA-C:  Thank you for your kind referral of Shelby Wang for consultation of ***. Although *** history is well known to you, please allow [place] to reiterate it for the purpose of our medical record. The patient was accompanied to the clinic by *** who also provides collateral information.     History of Present Illness: Shelby Wang is a 53 y.o. ***-handed Caucasian female with history of breast cancer s/p bilateral mastectomy, COPD, insomnia ***presenting for evaluation of ***.    On October 02, 2017, went to the ER after developing right hand and forearm numbness and pain after waking up on the floor, presumably sleepwalking.  She was diagnosed with radial nerve palsy and given a cock-up wrist splint.  ***  Out-side paper records, electronic medical record, and images have been reviewed where available and summarized as: *** CT head and cervical spine 12/27/2013: HEAD CT:  No acute intracranial abnormality.  No skull fracture.  CERVICAL CT:  Normal  Past Medical History:  Diagnosis Date  . Anxiety   . Arthritis   . Asthma   . Atrophic vaginitis   . Bipolar disorder ([place])   . Cancer [facility])    cervical cancer s/p hysterectomy  . Cervical cancer ([place])   . Chronic pain    goes to pain clinic  . Chronic pain    goes to pain clinic for oxycodone  . COPD (chronic obstructive pulmonary disease) ([place])   . Emphysema/COPD ([place])   . Eye cancer [facility]) 2013   left eye  . Herniated disc   . Seizures ([place])   . Urethral diverticulum     Past Surgical History:  Procedure Laterality Date  . ABDOMINAL HYSTERECTOMY     cervical cancer age 21  . BREAST BIOPSY Right 11-16-13   BRCA II positive  . BREAST RECONSTRUCTION Bilateral 11/21/2014   Procedure: BILATERAL BREAST RECONSTRUCTION WITH PLACEMENT OF TISSUE EXPANDER;   Surgeon: Cristine Polio, MD;  Location: [place];  Service: Plastics;  Laterality: Bilateral;  . CESAREAN SECTION    . MASTECTOMY Bilateral 06-23-14  . TISSUE EXPANDER PLACEMENT Bilateral 11/21/2014   Procedure: TISSUE EXPANDER;  Surgeon: Cristine Polio, MD;  Location: [place];  Service: Plastics;  Laterality: Bilateral;  . TOOTH EXTRACTION  06-09-14  . TOTAL ABDOMINAL HYSTERECTOMY W/ BILATERAL SALPINGOOPHORECTOMY    . VAGINAL PROLAPSE REPAIR  2012     Medications:  Outpatient Encounter Medications as of 03/13/2018  Medication Sig Note  . albuterol (ACCUNEB) 1.25 MG/3ML nebulizer solution Inhale 1 ampule by nebulization every six (6) hours as needed for wheezing. 10/11/2015: [facility]  . albuterol (PROVENTIL) (2.5 MG/3ML) 0.083% nebulizer solution 2.5 mg. 10/11/2015: Received from: [place]: Inhale 3 mL (2.5 mg total) by nebulization every six (6) hours as needed for wheezing.  Shelby Kitchen albuterol-ipratropium (COMBIVENT) 18-103 MCG/ACT inhaler Inhale into the lungs. 10/11/2015: Received from: [place]: Inhale 1 puff Four (4) times a day.  Shelby Kitchen LYRICA 150 MG capsule TAKE 1 CAPSULE BY MOUTH TWICE A DAY FOR PAIN 10/11/2015: Received from: External Pharmacy  . oxyCODONE-acetaminophen (PERCOCET/ROXICET) 5-325 MG tablet Take 1 tablet by mouth every 8 (eight) hours as needed. for pain 10/12/2015: Received from: External Pharmacy Received Sig: TAKE 1 TABLET BY MOUTH EVERY 8 HOURS AS NEEDED FOR  PAIN  . sertraline (ZOLOFT) 100 MG tablet Take 100 mg by mouth daily.   Shelby Kitchen tiotropium (SPIRIVA) 18 MCG inhalation capsule Place into inhaler and inhale. 10/11/2015: Received from: [place]: Place 18 mcg into inhaler and inhale daily.  . traZODone (DESYREL) 100 MG tablet Take 100 mg by mouth at bedtime.    No facility-administered encounter medications on file as of 03/13/2018.      Allergies:  Allergies  Allergen  Reactions  . Adhesive [Tape] Other (See Comments)    Tears skin  . Codeine Hives  . Nsaids Other (See Comments)    Upset stomach  . Sulfa Antibiotics Hives  . Amoxicillin Rash  . Penicillins Rash    Family History: Family History  Problem Relation Age of Onset  . Cancer Mother        ovarian, breast  . Heart disease Mother   . Cancer Father   . Cancer Sister        brain  . Breast cancer Sister   . Breast cancer Maternal Aunt   . BRCA 1/2 Son 72       positive  . BRCA 1/2 Daughter 73       negative    Social History: Social History   Tobacco Use  . Smoking status: Current Every Day Smoker    Packs/day: 1.00    Types: Cigarettes  . Smokeless tobacco: Never Used  Substance Use Topics  . Alcohol use: No    Alcohol/week: 0.0 standard drinks  . Drug use: Yes    Types: Oxycodone   Social History   Social History Narrative  . Not on file    Review of Systems:  CONSTITUTIONAL: No fevers, chills, night sweats, or weight loss.  *** EYES: No visual changes or eye pain ENT: No hearing changes.  No history of nose bleeds.   RESPIRATORY: No cough, wheezing and shortness of breath.   CARDIOVASCULAR: Negative for chest pain, and palpitations.   GI: Negative for abdominal discomfort, blood in stools or black stools.  No recent change in bowel habits.   GU:  No history of incontinence.   MUSCLOSKELETAL: No history of joint pain or swelling.  No myalgias.   SKIN: Negative for lesions, rash, and itching.   HEMATOLOGY/ONCOLOGY: Negative for prolonged bleeding, bruising easily, and swollen nodes.  No history of cancer.   ENDOCRINE: Negative for cold or heat intolerance, polydipsia or goiter.   PSYCH:  ***depression or anxiety symptoms.   NEURO: As Above.   Vital Signs:  There were no vitals taken for this visit. Pain Scale: *** on a scale of 0-10   General Medical Exam:  *** General:  Well appearing, comfortable.   Eyes/ENT: see cranial nerve examination.   Neck: No  masses appreciated.  Full range of motion without tenderness.  No carotid bruits. Respiratory:  Clear to auscultation, good air entry bilaterally.   Cardiac:  Regular rate and rhythm, no murmur.   Extremities:  No deformities, edema, or skin discoloration.  Skin:  No rashes or lesions.  Neurological Exam: MENTAL STATUS including orientation to time, place, person, recent and remote memory, attention span and concentration, language, and fund of knowledge is ***normal.  Speech is not dysarthric.  CRANIAL NERVES: II:  No visual field defects.  Unremarkable fundi.   III-IV-VI: Pupils equal round and reactive to light.  Normal conjugate, extra-ocular eye movements in all directions of gaze.  No nystagmus.  No ptosis***.   V:  Normal facial  sensation.  Jaw jerk is ***.   VII:  Normal facial symmetry and movements.  No pathologic facial reflexes.  VIII:  Normal hearing and vestibular function.   IX-X:  Normal palatal movement.   XI:  Normal shoulder shrug and head rotation.   XII:  Normal tongue strength and range of motion, no deviation or fasciculation.  MOTOR:  No atrophy, fasciculations or abnormal movements.  No pronator drift.  Tone is normal.    Right Upper Extremity:    Left Upper Extremity:    Deltoid  5/5   Deltoid  5/5   Biceps  5/5   Biceps  5/5   Triceps  5/5   Triceps  5/5   Wrist extensors  5/5   Wrist extensors  5/5   Wrist flexors  5/5   Wrist flexors  5/5   Finger extensors  5/5   Finger extensors  5/5   Finger flexors  5/5   Finger flexors  5/5   Dorsal interossei  5/5   Dorsal interossei  5/5   Abductor pollicis  5/5   Abductor pollicis  5/5   Tone (Ashworth scale)  0  Tone (Ashworth scale)  0   Right Lower Extremity:    Left Lower Extremity:    Hip flexors  5/5   Hip flexors  5/5   Hip extensors  5/5   Hip extensors  5/5   Knee flexors  5/5   Knee flexors  5/5   Knee extensors  5/5   Knee extensors  5/5   Dorsiflexors  5/5   Dorsiflexors  5/5   Plantarflexors   5/5   Plantarflexors  5/5   Toe extensors  5/5   Toe extensors  5/5   Toe flexors  5/5   Toe flexors  5/5   Tone (Ashworth scale)  0  Tone (Ashworth scale)  0   MSRs:  Right                                                                 Left brachioradialis 2+  brachioradialis 2+  biceps 2+  biceps 2+  triceps 2+  triceps 2+  patellar 2+  patellar 2+  ankle jerk 2+  ankle jerk 2+  Hoffman no  Hoffman no  plantar response down  plantar response down   SENSORY:  Normal and symmetric perception of light touch, pinprick, vibration, and proprioception.  Romberg's sign absent.   COORDINATION/GAIT: Normal finger-to- nose-finger and heel-to-shin.  Intact rapid alternating movements bilaterally.  Able to rise from a chair without using arms.  Gait narrow based and stable. Tandem and stressed gait intact.    IMPRESSION: *** NCS/EMG of the right arm - radial nerve protocol PLAN/RECOMMENDATIONS:  *** Return to clinic in *** months.   The duration of this appointment visit was *** minutes of face-to-face time with the patient.  Greater than 50% of this time was spent in counseling, explanation of diagnosis, planning of further management, and coordination of care.   Thank you for allowing me to participate in patient's care.  If I can answer any additional questions, I would be pleased to do so.    Sincerely,     K. Posey Pronto, DO

## 2018-08-31 ENCOUNTER — Ambulatory Visit (INDEPENDENT_AMBULATORY_CARE_PROVIDER_SITE_OTHER): Payer: 59 | Admitting: Internal Medicine

## 2018-08-31 DIAGNOSIS — J449 Chronic obstructive pulmonary disease, unspecified: Secondary | ICD-10-CM | POA: Diagnosis not present

## 2018-08-31 MED ORDER — OMEPRAZOLE 20 MG PO CPDR: 20.0000 mg | DELAYED_RELEASE_CAPSULE | Freq: Every day | ORAL | 11 refills | Status: DC

## 2018-08-31 NOTE — Patient Instructions (Addendum)
Will check a test to see if you need oxygen overnight.  Will check a blood test to look for genetic causes of emphysema.  Will start omeprazole for reflux. If not covered by insurance, you can buy it over the counter.  Use advair twice daily (rinse mouth after use). Use combivent and nebulizer 3 to 4 times per day, using them more often than that can be dangerous.  We will refer you to pulmonary rehab once COVID danger has passed.    We will give you the number for the [place] Quit Line.  --Quitting smoking is the most important thing that you can do for your health.  --Quitting smoking will have greater affect on your health than any medicine that we can give you.

## 2018-08-31 NOTE — Progress Notes (Signed)
Shelby Wang    Virtual Visit via Video Note I connected with patient on 08/31/18 at  3:30 PM EDT by video and verified that I am speaking with the correct person using two identifiers.   I discussed the limitations, risks of performing an evaluation and management service by video and the availability of in person appointments. I also discussed with the patient that there may be a patient responsible charge related to this service.  In light of current covid-19 pandemic, patient also understands that we are trying to protect them by minimizing in office contact if at all possible.  The patient expressed understanding and agreed to proceed. Please see note below for further detail.    The patient was advised to call back or seek an in-person evaluation if the symptoms worsen or if the condition fails to improve as anticipated.   Shelby Hobby, MD   Assessment and Plan:  COPD/emphysema. - Based on most recent CT chest, this appears to be quite advanced. - Discussed the importance of smoke cessation, will check alpha-1. - Asked that the patient use Spiriva once daily, Advair twice daily and rinse mouth after use.  I counseled her on the danger of excessive use of pro-air, Combivent, nebulized albuterol.  I recommended that she use Combivent and nebulized albuterol 3-4 times daily, and that 4 hours needs to go by between each use.  Dyspnea on exertion. - Discussed that her dyspnea on exertion will likely advanced as she continues to smoke. - Discussed that the most important thing to her prevent this is to quit smoking. - The patient may benefit from referral to pulmonary rehab in the future once COVID restrictions have lifted.  Nicotine abuse. - Discussed the importance of smoke station, spent 3 minutes in discussion.  GERD. - Patient has significant, symptomatic GERD which may be contributing to dyspnea. - We will start on omeprazole once  daily.  Meds ordered this encounter  Medications  . omeprazole (PRILOSEC) 20 MG capsule    Sig: Take 1 capsule (20 mg total) by mouth daily.    Dispense:  30 capsule    Refill:  11   Orders Placed This Encounter  Procedures  . Alpha-1-antitrypsin  . Alpha-1 antitrypsin phenotype  . Pulse oximetry, overnight   Return in about 6 weeks (around 10/12/2018).   Date: 08/31/2018  MRN# [id number] Shelby Wang [birth year]  Referring Physician: [facility].   Shelby Wang is a 53 y.o. old female seen in [place] for chief complaint of: sleepiness, dyspnea.    HPI:  Shelby Wang is a 53 y.o. old female with a history of breast cancer, BRCA with double mastectomy.  She has an apparent history of COPD, chronic insomnia and daytime sleepiness, she takes trazodone 100 mg nightly, Lyrica 150 mg 3 times daily, Klonopin 0.5 mg twice daily.  She notes that she has dyspnea with mild activity. She is using spiriva inhaler once daily, combivent 8-9 times per day, and then she runs out of it early, she uses nebs three times per day, proair 10 times per day. She does not think that the combivent and the proair are helping. She is using advair three times per day.  She is smoking half pack per day. She is ready to try to quit but had not quit yet.  Her breathing has been short progressively over the past 5 months. She has "really bad" reflux, she takes prn meds such as MOM.  She has 6 dogs, 1 kitten, the dog sleeps with her.   She is not on oxygen.   **CT chest 03/03/2018, chest x-ray 08/01/2018>> emphysematous changes, worse in the apices.  Mild bronchiectatic changes, calcified subcarinal and right hilar lymph nodes.  PMHX:   Past Medical History:  Diagnosis Date  . Anxiety   . Arthritis   . Asthma   . Atrophic vaginitis   . Bipolar disorder ([place])   . Cancer [facility])    cervical cancer s/p hysterectomy  . Cervical cancer ([place])   . Chronic pain    goes to pain clinic  .  Chronic pain    goes to pain clinic for oxycodone  . COPD (chronic obstructive pulmonary disease) ([place])   . Emphysema/COPD ([place])   . Eye cancer [facility]) 2013   left eye  . Herniated disc   . Seizures ([place])   . Urethral diverticulum    Surgical Hx:  Past Surgical History:  Procedure Laterality Date  . ABDOMINAL HYSTERECTOMY     cervical cancer age 14  . BREAST BIOPSY Right 11-16-13   BRCA II positive  . BREAST RECONSTRUCTION Bilateral 11/21/2014   Procedure: BILATERAL BREAST RECONSTRUCTION WITH PLACEMENT OF TISSUE EXPANDER;  Surgeon: Cristine Polio, MD;  Location: [place];  Service: Plastics;  Laterality: Bilateral;  . CESAREAN SECTION    . MASTECTOMY Bilateral 06-23-14  . TISSUE EXPANDER PLACEMENT Bilateral 11/21/2014   Procedure: TISSUE EXPANDER;  Surgeon: Cristine Polio, MD;  Location: [place];  Service: Plastics;  Laterality: Bilateral;  . TOOTH EXTRACTION  06-09-14  . TOTAL ABDOMINAL HYSTERECTOMY W/ BILATERAL SALPINGOOPHORECTOMY    . VAGINAL PROLAPSE REPAIR  2012   Family Hx:  Family History  Problem Relation Age of Onset  . Cancer Mother        ovarian, breast  . Heart disease Mother   . Cancer Father   . Cancer Sister        brain  . Breast cancer Sister   . Breast cancer Maternal Aunt   . BRCA 1/2 Son 64       positive  . BRCA 1/2 Daughter 82       negative   Social Hx:   Social History   Tobacco Use  . Smoking status: Current Every Day Smoker    Packs/day: 1.00    Types: Cigarettes  . Smokeless tobacco: Never Used  Substance Use Topics  . Alcohol use: No    Alcohol/week: 0.0 standard drinks  . Drug use: Yes    Types: Oxycodone   Medication:    Current Outpatient Medications:  .  albuterol (ACCUNEB) 1.25 MG/3ML nebulizer solution, Inhale 1 ampule by nebulization every six (6) hours as needed for wheezing., Disp: , Rfl:  .  albuterol (PROVENTIL) (2.5 MG/3ML) 0.083% nebulizer solution, 2.5 mg., Disp: , Rfl:  .   albuterol-ipratropium (COMBIVENT) 18-103 MCG/ACT inhaler, Inhale into the lungs., Disp: , Rfl:  .  LYRICA 150 MG capsule, TAKE 1 CAPSULE BY MOUTH TWICE A DAY FOR PAIN, Disp: , Rfl: 2 .  oxyCODONE-acetaminophen (PERCOCET/ROXICET) 5-325 MG tablet, Take 1 tablet by mouth every 8 (eight) hours as needed. for pain, Disp: , Rfl: 0 .  sertraline (ZOLOFT) 100 MG tablet, Take 100 mg by mouth daily., Disp: , Rfl:  .  tiotropium (SPIRIVA) 18 MCG inhalation capsule, Place into inhaler and inhale., Disp: , Rfl:  .  traZODone (DESYREL) 100 MG tablet, Take 100 mg by mouth at bedtime., Disp: , Rfl:  Allergies:  Adhesive [tape], Codeine, Nsaids, Sulfa antibiotics, Amoxicillin, and Penicillins  Review of Systems: Gen:  Denies  fever, sweats, chills HEENT: Denies blurred vision, double vision. bleeds, sore throat Cvc:  No dizziness, chest pain. Resp:   Denies cough or sputum production, shortness of breath Gi: Denies swallowing difficulty, stomach pain. Gu:  Denies bladder incontinence, burning urine Ext:   No Joint pain, stiffness. Skin: No skin rash,  hives  Endoc:  No polyuria, polydipsia. Psych: No depression, insomnia. Other:  All other systems were reviewed with the patient and were negative other that what is mentioned in the HPI.   Physical Examination:      LABORATORY PANEL:   CBC No results for input(s): WBC, HGB, HCT, PLT in the last 168 hours. ------------------------------------------------------------------------------------------------------------------  Chemistries  No results for input(s): NA, K, CL, CO2, GLUCOSE, BUN, CREATININE, CALCIUM, MG, AST, ALT, ALKPHOS, BILITOT in the last 168 hours.  Invalid input(s): GFRCGP ------------------------------------------------------------------------------------------------------------------  Cardiac Enzymes No results for input(s): TROPONINI in the last 168 hours. ------------------------------------------------------------   RADIOLOGY:  No results found.     Thank  you for the [facility] and for allowing [facility] to assist in the care of your patient. Our recommendations are noted above.  Please contact us if we can be of further service.   Marda Stalker, M.D., F.C.C.P.  Board Certified in Internal Medicine, Pulmonary Medicine, [place], and Sleep Medicine.  [facility] Office Number: [phone]   08/31/2018

## 2018-10-09 ENCOUNTER — Encounter: Payer: Self-pay | Admitting: Internal Medicine

## 2018-10-09 ENCOUNTER — Telehealth: Payer: Self-pay | Admitting: Internal Medicine

## 2018-10-09 NOTE — Telephone Encounter (Signed)

## 2018-10-11 NOTE — Progress Notes (Signed)
[facility] Consultation    Virtual Visit via Video Note I connected with patient on 10/12/18 at 11:00 AM EDT by video and verified that I am speaking with the correct person using two identifiers.   I discussed the limitations, risks of performing an evaluation and management service by video and the availability of in person appointments. I also discussed with the patient that there may be a patient responsible charge related to this service.  In light of current covid-19 pandemic, patient also understands that we are trying to protect them by minimizing in office contact if at all possible.  The patient expressed understanding and agreed to proceed. Please see note below for further detail.    The patient was advised to call back or seek an in-person evaluation if the symptoms worsen or if the condition fails to improve as anticipated. I spent 22 minutes of face-to-face time during this visit.   Laverle Hobby, MD  Assessment and Plan:  COPD/emphysema. - Based on most recent CT chest, this appears to be quite advanced. - Discussed the importance of smoke cessation, will check alpha-1 after covid restrictions removed and pt is no longer smoking.  - Continue spiriva.   Dyspnea on exertion. - Discussed that her dyspnea on exertion will likely advanced as she continues to smoke. - Discussed that the most important thing to her prevent this is to quit smoking. - The patient may benefit from referral to pulmonary rehab in the future once COVID restrictions have lifted.  Nicotine abuse. - Discussed the importance of smoke station, spent 3 minutes in discussion.  GERD. - Patient has significant, symptomatic GERD which may be contributing to dyspnea. - We will start on omeprazole once daily.    Return in about 6 months (around 04/14/2019).   Date: 10/11/2018  MRN# [id number] Shelby Wang 1965/10/11  Referring Physician: [facility].    Shelby Wang is a 53 y.o. old female seen in consultation for chief complaint of: sleepiness, dyspnea.    HPI:  Shelby Wang is a 53 y.o. old female with a history of breast cancer with double mastectomy.  She has an apparent history of COPD, chronic insomnia and daytime sleepiness, she takes trazodone 100 mg nightly, Lyrica 150 mg 3 times daily, Klonopin 0.5 mg twice daily. Imaging showed severe emphysema. She was counselling on smoking cessation and dangers of excessive use of rescue inhalers. Alpha 1 was ordered but not done, she was started on omeprazole.  She has had an overnight oxymetry done last night.   She is smoking 1 pp 2 days, she is using chantix about 4 days ago and quit date is in 3 days. She is using spiriva once daily, proair once or twice per day, combivent twice per day.  She uses albuterol nebs as needed usually once per morning.    She is not on oxygen.   **CT chest 03/03/2018, chest x-ray 08/01/2018>> emphysematous changes, worse in the apices.  Mild bronchiectatic changes, calcified subcarinal and right hilar lymph nodes.  Medication:    Current Outpatient Medications:  .  albuterol (ACCUNEB) 1.25 MG/3ML nebulizer solution, Inhale 1 ampule by nebulization every six (6) hours as needed for wheezing., Disp: , Rfl:  .  albuterol (PROVENTIL) (2.5 MG/3ML) 0.083% nebulizer solution, 2.5 mg., Disp: , Rfl:  .  albuterol-ipratropium (COMBIVENT) 18-103 MCG/ACT inhaler, Inhale into the lungs., Disp: , Rfl:  .  LYRICA 150 MG capsule, TAKE 1 CAPSULE BY MOUTH TWICE  A DAY FOR PAIN, Disp: , Rfl: 2 .  omeprazole (PRILOSEC) 20 MG capsule, Take 1 capsule (20 mg total) by mouth daily., Disp: 30 capsule, Rfl: 11 .  oxyCODONE-acetaminophen (PERCOCET/ROXICET) 5-325 MG tablet, Take 1 tablet by mouth every 8 (eight) hours as needed. for pain, Disp: , Rfl: 0 .  sertraline (ZOLOFT) 100 MG tablet, Take 100 mg by mouth daily., Disp: , Rfl:  .  tiotropium (SPIRIVA) 18 MCG inhalation capsule,  Place into inhaler and inhale., Disp: , Rfl:  .  traZODone (DESYREL) 100 MG tablet, Take 100 mg by mouth at bedtime., Disp: , Rfl:    Allergies:  Adhesive [tape], Codeine, Nsaids, Sulfa antibiotics, Amoxicillin, and Penicillins  Review of Systems:  Constitutional: Feels well. Cardiovascular: Denies chest pain, exertional chest pain.  Pulmonary: Denies hemoptysis, pleuritic chest pain.   The remainder of systems were reviewed and were found to be negative other than what is documented in the HPI.     LABORATORY PANEL:   CBC No results for input(s): WBC, HGB, HCT, PLT in the last 168 hours. ------------------------------------------------------------------------------------------------------------------  Chemistries  No results for input(s): NA, K, CL, CO2, GLUCOSE, BUN, CREATININE, CALCIUM, MG, AST, ALT, ALKPHOS, BILITOT in the last 168 hours.  Invalid input(s): GFRCGP ------------------------------------------------------------------------------------------------------------------  Cardiac Enzymes No results for input(s): TROPONINI in the last 168 hours. ------------------------------------------------------------  RADIOLOGY:  No results found.     Thank  you for the consultation and for allowing [facility] to assist in the care of your patient. Our recommendations are noted above.  Please contact us if we can be of further service. Marda Stalker, M.D., F.C.C.P.  Board Certified in Internal Medicine, Pulmonary Medicine, [place], and Sleep Medicine.  [facility] Office Number: [phone]    10/11/2018

## 2018-10-12 ENCOUNTER — Ambulatory Visit (INDEPENDENT_AMBULATORY_CARE_PROVIDER_SITE_OTHER): Payer: 59 | Admitting: Internal Medicine

## 2018-10-12 DIAGNOSIS — J449 Chronic obstructive pulmonary disease, unspecified: Secondary | ICD-10-CM | POA: Diagnosis not present

## 2018-10-12 DIAGNOSIS — F17210 Nicotine dependence, cigarettes, uncomplicated: Secondary | ICD-10-CM | POA: Diagnosis not present

## 2018-10-12 NOTE — Patient Instructions (Signed)
Continue with chantix and stop smoking on your quit date.

## 2018-10-21 ENCOUNTER — Telehealth: Payer: Self-pay | Admitting: Internal Medicine

## 2018-10-21 DIAGNOSIS — G4719 Other hypersomnia: Secondary | ICD-10-CM

## 2018-10-21 NOTE — Telephone Encounter (Signed)
ONO reviewed by Dr. Ashby Dawes- study suggestive of OSA. Recommend HST. ATC pt- unable to leave voicemail, due to mailbox being full.

## 2018-10-22 NOTE — Telephone Encounter (Signed)
Left message x2 for pt. 

## 2018-10-23 NOTE — Telephone Encounter (Signed)
Left message for pt on home number on file.

## 2018-10-26 NOTE — Telephone Encounter (Signed)
Left message x 3 for pt.  Letter has been mailed to address on file.  Encounter will be closed per office protocol.

## 2018-11-02 NOTE — Addendum Note (Signed)
Addended by: Maryanna Shape A on: 11/02/2018 10:54 AM   Modules accepted: Orders

## 2018-11-02 NOTE — Telephone Encounter (Signed)
Pt is aware of results and voiced her understanding.  HST has been ordered.  Nothing further is needed at this time.

## 2018-11-10 ENCOUNTER — Telehealth: Payer: Self-pay | Admitting: Internal Medicine

## 2018-11-10 NOTE — Telephone Encounter (Signed)
Unable to obtain PA due to current card not being scanned into Epic.  Pt on my list to contact.  Pt provided insurance information. [employer], spoke with Sharyn Lull H and no PA required. Call Ref # [id number]. Pt has been scheduled for 11/11/2018 at 2:30. Pt is aware of appointment and has been screened for COVID and ok to proceed. Rhonda J Cobb

## 2018-11-11 ENCOUNTER — Other Ambulatory Visit: Payer: Self-pay

## 2018-11-11 ENCOUNTER — Ambulatory Visit: Payer: 59

## 2018-11-11 DIAGNOSIS — G4710 Hypersomnia, unspecified: Secondary | ICD-10-CM

## 2018-11-11 DIAGNOSIS — G4719 Other hypersomnia: Secondary | ICD-10-CM

## 2018-11-13 ENCOUNTER — Telehealth: Payer: Self-pay

## 2018-11-13 ENCOUNTER — Encounter: Payer: Self-pay | Admitting: Internal Medicine

## 2018-11-13 DIAGNOSIS — G4710 Hypersomnia, unspecified: Secondary | ICD-10-CM

## 2018-11-13 NOTE — Telephone Encounter (Addendum)
HST performed on 11/11/2018 did not show OSA. If OSA id still strongly suspected, consider repeat home sleep study or in lab study. Educate patient on maintenance of good sleep hygiene.  Pt is aware and voiced her understanding. Nothing further is needed.

## 2019-01-13 ENCOUNTER — Ambulatory Visit: Payer: 59 | Admitting: Pulmonary Disease

## 2019-02-12 ENCOUNTER — Encounter: Payer: Self-pay | Admitting: Pulmonary Disease

## 2019-02-12 ENCOUNTER — Other Ambulatory Visit: Payer: Self-pay

## 2019-02-12 ENCOUNTER — Ambulatory Visit (INDEPENDENT_AMBULATORY_CARE_PROVIDER_SITE_OTHER): Payer: 59 | Admitting: Pulmonary Disease

## 2019-02-12 ENCOUNTER — Other Ambulatory Visit
Admission: RE | Admit: 2019-02-12 | Discharge: 2019-02-12 | Disposition: A | Discharge status: Home / Self Care | Payer: 59 | Source: Ambulatory Visit | Attending: Pulmonary Disease | Admitting: Pulmonary Disease

## 2019-02-12 VITALS — BP 128/78 | HR 84 | Temp 97.8°F | Ht <= 58 in | Wt 157.0 lb

## 2019-02-12 DIAGNOSIS — Z720 Tobacco use: Secondary | ICD-10-CM

## 2019-02-12 DIAGNOSIS — J439 Emphysema, unspecified: Secondary | ICD-10-CM

## 2019-02-12 DIAGNOSIS — G4734 Idiopathic sleep related nonobstructive alveolar hypoventilation: Secondary | ICD-10-CM | POA: Insufficient documentation

## 2019-02-12 DIAGNOSIS — R918 Other nonspecific abnormal finding of lung field: Secondary | ICD-10-CM

## 2019-02-12 DIAGNOSIS — F17210 Nicotine dependence, cigarettes, uncomplicated: Secondary | ICD-10-CM | POA: Diagnosis not present

## 2019-02-12 DIAGNOSIS — R911 Solitary pulmonary nodule: Secondary | ICD-10-CM

## 2019-02-12 DIAGNOSIS — Z79899 Other long term (current) drug therapy: Secondary | ICD-10-CM

## 2019-02-12 LAB — CBC WITH DIFFERENTIAL/PLATELET
Abs Immature Granulocytes: 0.01 10*3/uL (ref 0.00–0.07)
Basophils Absolute: 0.1 10*3/uL (ref 0.0–0.1)
Basophils Relative: 1 %
Eosinophils Absolute: 0 10*3/uL (ref 0.0–0.5)
Eosinophils Relative: 1 %
HCT: 39.2 % (ref 36.0–46.0)
Hemoglobin: 12.4 g/dL (ref 12.0–15.0)
Immature Granulocytes: 0 %
Lymphocytes Relative: 28 %
Lymphs Abs: 1.6 10*3/uL (ref 0.7–4.0)
MCH: 27.4 pg (ref 26.0–34.0)
MCHC: 31.6 g/dL (ref 30.0–36.0)
MCV: 86.5 fL (ref 80.0–100.0)
Monocytes Absolute: 0.4 10*3/uL (ref 0.1–1.0)
Monocytes Relative: 7 %
Neutro Abs: 3.6 10*3/uL (ref 1.7–7.7)
Neutrophils Relative %: 63 %
Platelets: 133 10*3/uL — ABNORMAL LOW (ref 150–400)
RBC: 4.53 MIL/uL (ref 3.87–5.11)
RDW: 16.2 % — ABNORMAL HIGH (ref 11.5–15.5)
WBC: 5.6 10*3/uL (ref 4.0–10.5)
nRBC: 0 % (ref 0.0–0.2)

## 2019-02-12 LAB — COMPREHENSIVE METABOLIC PANEL
ALT: 17 U/L (ref 0–44)
AST: 21 U/L (ref 15–41)
Albumin: 3.7 g/dL (ref 3.5–5.0)
Alkaline Phosphatase: 93 U/L (ref 38–126)
Anion gap: 8 (ref 5–15)
BUN: 9 mg/dL (ref 6–20)
CO2: 30 mmol/L (ref 22–32)
Calcium: 8.5 mg/dL — ABNORMAL LOW (ref 8.9–10.3)
Chloride: 103 mmol/L (ref 98–111)
Creatinine, Ser: 0.92 mg/dL (ref 0.44–1.00)
GFR calc Af Amer: >60 mL/min (ref >60)
GFR calc non Af Amer: >60 mL/min (ref >60)
Glucose, Bld: 58 mg/dL — ABNORMAL LOW (ref 70–99)
Potassium: 3.7 mmol/L (ref 3.5–5.1)
Sodium: 141 mmol/L (ref 135–145)
Total Bilirubin: 0.5 mg/dL (ref 0.3–1.2)
Total Protein: 6.6 g/dL (ref 6.5–8.1)

## 2019-02-12 MED ORDER — PREDNISONE 10 MG PO TABS: ORAL_TABLET | ORAL | 0 refills | Status: DC

## 2019-02-12 MED ORDER — DOXYCYCLINE HYCLATE 100 MG PO TABS: 100.0000 mg | ORAL_TABLET | Freq: Two times a day (BID) | ORAL | 0 refills | Status: DC

## 2019-02-12 MED ORDER — TRELEGY ELLIPTA 100-62.5-25 MCG/INH IN AEPB: 1.0000 | INHALATION_SPRAY | Freq: Every day | RESPIRATORY_TRACT | 0 refills | Status: AC

## 2019-02-12 NOTE — Assessment & Plan Note (Signed)
Plan: Emphasized need for the patient to stop smoking We will schedule patient for telephonic follow-up with clinical pharmacy team to help with smoking cessation

## 2019-02-12 NOTE — Assessment & Plan Note (Signed)
We will stop Spiriva HandiHaler today We will start Trelegy Ellipta 100, samples provided today  2-week follow-up with our office to see how patient is doing with Trelegy Ellipta

## 2019-02-12 NOTE — Progress Notes (Signed)
_0  ID: Shelby Wang, female    DOB: [birth year], 53 y.o.   MRN: [id number]  Chief Complaint  Patient presents with  . Follow-up    pt reports of increased sob with exertion, wheezing and prod cough with green to yellow mucus.     Referring provider: Nat Christen, PA-C  HPI:  53 year old female current smoker followed in our office for COPD  PMH: Long-term opiate use, mood disorder, BRCA positive, chronic pain Smoker/ Smoking History: Current smoker.  0.5 packs/day.  80-pack-year smoking history Maintenance: Spiriva Handihaler   Pt of: Former Pt of Dr. Ashby Dawes  02/12/2019  - Visit   53 year old female last seen in our office in August/2020.  That was a virtual visit.  She is a former patient of Dr. Ashby Dawes.  She is being followed in our office for COPD/emphysema.  She is maintained on Spiriva.  Emphasized the need for her to stop smoking.  She continues to have dyspnea on exertion.  Patient would likely benefit from referral to pulmonary rehab.   Patient reporting to our office today.  Patient is extremely dyspneic.  She routinely uses her ProAir 10 times a day.  She uses her albuterol nebulized meds 4 times a day.  She also use her Combivent as needed.  She continues to use her Spiriva HandiHaler.  She is unsure if these medications are working well for her.  She continues to smoke 0.5 packs/day.  She feels that she may need oxygen.  She has a overnight oximetry test on file from August/2020 that shows that she does have nocturnal hypoxemia.  Unfortunately this is 30 days outside of that order test that this will likely need to be repeated.  Questionaires / Pulmonary Flowsheets:   MMRC: mMRC Dyspnea Scale mMRC Score  02/12/2019 3    Epworth:  Results of the Epworth flowsheet 11/02/2018  Sitting and reading 2  Watching TV 3  Sitting, inactive in a public place (e.g. a theatre or a meeting) 3  As a passenger in a car for an hour without a break 2  Lying down to  rest in the afternoon when circumstances permit 3  Sitting and talking to someone 0  Sitting quietly after a lunch without alcohol 3  In a car, while stopped for a few minutes in traffic 0  Total score 16    Tests:   07/26/2017-CTA chest-no PE, 4 to 5 mm nodular opacities left lower lobe, recommending follow-up in 12 months.  Underlying centrilobular and paraseptal emphysema  11/11/2018-home sleep study-AHI 1.5, SaO2 low 81%  10/09/2018-overnight oximetry on room air-total time sleeping 9 hours and 7 minutes, time less than 88% 380 minutes  FENO:  No results found for: NITRICOXIDE  PFT: No flowsheet data found.  WALK:  No flowsheet data found.  Imaging: No results found.  Lab Results:  CBC    Component Value Date/Time   WBC 6.5 03/29/2015 1215   RBC 5.31 (H) 03/29/2015 1215   HGB 14.3 03/29/2015 1742   HGB 14.9 06/21/2014 1252   HCT 42.0 03/29/2015 1742   HCT 44.9 06/21/2014 1252   PLT 208 03/29/2015 1215   PLT 276 06/21/2014 1252   MCV 88.1 03/29/2015 1215   MCV 91 06/21/2014 1252   MCH 29.0 03/29/2015 1215   MCHC 32.9 03/29/2015 1215   RDW 14.6 03/29/2015 1215   RDW 13.7 06/21/2014 1252   LYMPHSABS 1.9 12/03/2014 1710   LYMPHSABS 1.6 06/21/2014 1252   MONOABS 0.5 12/03/2014 1710  MONOABS 0.4 06/21/2014 1252   EOSABS 0.2 12/03/2014 1710   EOSABS 0.0 06/21/2014 1252   BASOSABS 0.1 12/03/2014 1710   BASOSABS 0.0 06/21/2014 1252    BMET    Component Value Date/Time   NA 139 10/12/2015 1012   NA 137 06/21/2014 1252   K 3.6 10/12/2015 1012   K 4.3 06/21/2014 1252   CL 107 10/12/2015 1012   CL 104 06/21/2014 1252   CO2 24 10/12/2015 1012   CO2 28 06/21/2014 1252   GLUCOSE 123 (H) 10/12/2015 1012   GLUCOSE 115 (H) 06/21/2014 1252   BUN 19 10/12/2015 1012   BUN 10 06/21/2014 1252   CREATININE 0.82 10/12/2015 1012   CREATININE 0.86 06/21/2014 1252   CREATININE 0.70 09/25/2010 1531   CALCIUM 8.9 10/12/2015 1012   CALCIUM 9.5 06/21/2014 1252   GFRNONAA  >60 10/12/2015 1012   GFRNONAA >60 06/21/2014 1252   GFRAA >60 10/12/2015 1012   GFRAA >60 06/21/2014 1252    BNP No results found for: BNP  ProBNP No results found for: PROBNP  Specialty Problems      Pulmonary Problems   COPD (chronic obstructive pulmonary disease) (HCC)    07/26/2017-CTA chest-no PE, 4 to 5 mm nodular opacities left lower lobe, recommending follow-up in 12 months.  Underlying centrilobular and paraseptal emphysema       Airway hyperreactivity   Nocturnal hypoxemia    10/09/2018-overnight oximetry on room air-total time sleeping 9 hours and 7 minutes, time less than 88% 380 minutes         Allergies  Allergen Reactions  . Adhesive [Tape] Other (See Comments)    Tears skin  . Codeine Hives  . Nsaids Other (See Comments)    Upset stomach  . Sulfa Antibiotics Hives  . Amoxicillin Rash  . Penicillins Rash    Immunization History  Administered Date(s) Administered  . Influenza,inj,Quad PF,6+ Mos 04/28/2016, 11/07/2018  . Pneumococcal Polysaccharide-23 04/28/2016  . Tdap 03/22/2013    Past Medical History:  Diagnosis Date  . Anxiety   . Arthritis   . Asthma   . Atrophic vaginitis   . Bipolar disorder (HCC)   . Cancer (HCC)    cervical cancer s/p hysterectomy  . Cervical cancer (HCC)   . Chronic pain    goes to pain clinic  . Chronic pain    goes to pain clinic for oxycodone  . COPD (chronic obstructive pulmonary disease) (HCC)   . Emphysema/COPD (HCC)   . Eye cancer (HCC) 2013   left eye  . Herniated disc   . Seizures (HCC)   . Urethral diverticulum     Tobacco History: Social History   Tobacco Use  Smoking Status Current Every Day Smoker  . Packs/day: 2.00  . Years: 40.00  . Pack years: 80.00  . Types: Cigarettes  . Start date: 03/04/1978  Smokeless Tobacco Never Used  Tobacco Comment   started decreasing down in 2015, 0.5 pack daily- 02/12/2019   Ready to quit: Yes Counseling given: Yes Comment: started decreasing down in  2015, 0.5 pack daily- 02/12/2019  Smoking assessment and cessation counseling  Patient currently smoking: 0.5 packs/day I have advised the patient to quit/stop smoking as soon as possible due to high risk for multiple medical problems.  It will also be very difficult for us to manage patient's  respiratory symptoms and status if we continue to expose her lungs to a known irritant.  We do not advise e-cigarettes as a form of stopping smoking.    Patient is willing to quit smoking.  Not set quit date  Previous methods of stopping smoking the patient issues: Chantix, she felt that this made her want to smoke more.  She is used nicotine patches but she did not feel that these helped her cravings at all.  When she used nicotine gum she got overly nauseous.  She never used her nicotine patches with short-term nicotine replacement.  She has not used Wellbutrin.  She is interested in maybe even thinking of hypnotherapy or acupuncture.  I have advised the patient that we can assist and have options of nicotine replacement therapy, provided smoking cessation education today, provided smoking cessation counseling, and provided cessation resources.  Follow-up next office visit office visit for assessment of smoking cessation.  Smoking cessation counseling advised for: 8min   Outpatient Encounter Medications as of 02/12/2019  Medication Sig  . albuterol (PROVENTIL) (2.5 MG/3ML) 0.083% nebulizer solution 2.5 mg.  . albuterol-ipratropium (COMBIVENT) 18-103 MCG/ACT inhaler Inhale into the lungs.  . buprenorphine-naloxone (SUBOXONE) 8-2 mg SUBL SL tablet Place 1 tablet under the tongue 3 (three) times daily.  . clonazePAM (KLONOPIN) 0.5 MG tablet Take by mouth.  . LYRICA 150 MG capsule TAKE 1 CAPSULE BY MOUTH TWICE A DAY FOR PAIN  . omeprazole (PRILOSEC) 20 MG capsule Take 1 capsule (20 mg total) by mouth daily.  . PROAIR HFA 108 (90 Base) MCG/ACT inhaler   . sertraline (ZOLOFT) 100 MG tablet Take 100 mg by  mouth daily.  . tiotropium (SPIRIVA) 18 MCG inhalation capsule Place into inhaler and inhale.  . traZODone (DESYREL) 100 MG tablet Take 100 mg by mouth at bedtime.  . [DISCONTINUED] albuterol (ACCUNEB) 1.25 MG/3ML nebulizer solution Inhale 1 ampule by nebulization every six (6) hours as needed for wheezing.  . doxycycline (VIBRA-TABS) 100 MG tablet Take 1 tablet (100 mg total) by mouth 2 (two) times daily.  . Fluticasone-Umeclidin-Vilant (TRELEGY ELLIPTA) 100-62.5-25 MCG/INH AEPB Inhale 1 puff into the lungs daily for 1 day.  . predniSONE (DELTASONE) 10 MG tablet Take 4 tabs x 2d, then 3 tabs x 2d, 2 tabs x 2d, 1 tab x 2 days and stop.  . [DISCONTINUED] oxyCODONE-acetaminophen (PERCOCET/ROXICET) 5-325 MG tablet Take 1 tablet by mouth every 8 (eight) hours as needed. for pain   No facility-administered encounter medications on file as of 02/12/2019.     Review of Systems  Review of Systems  Constitutional: Positive for fatigue. Negative for activity change and fever.  HENT: Negative for sinus pressure, sinus pain and sore throat.   Respiratory: Positive for cough, shortness of breath and wheezing.   Cardiovascular: Negative for chest pain and palpitations.  Gastrointestinal: Negative for diarrhea, nausea and vomiting.  Musculoskeletal: Negative for arthralgias.  Neurological: Negative for dizziness.  Psychiatric/Behavioral: Negative for sleep disturbance. The patient is not nervous/anxious.      Physical Exam  BP 128/78 (BP Location: Left Arm, Cuff Size: Normal)   Pulse 84   Temp 97.8 F (36.6 C) (Temporal)   Ht 4' 9" (1.448 m)   Wt 157 lb (71.2 kg)   SpO2 94%   BMI 33.97 kg/m   Wt Readings from Last 5 Encounters:  02/12/19 157 lb (71.2 kg)  10/12/15 130 lb (59 kg)  11/21/14 130 lb 6 oz (59.1 kg)  07/06/14 127 lb (57.6 kg)  06/28/14 126 lb (57.2 kg)    BMI Readings from Last 5 Encounters:  02/12/19 33.97 kg/m  10/12/15 25.39 kg/m  11/21/14 26.33 kg/m  07/06/14  25.65   kg/m  06/28/14 25.45 kg/m     Physical Exam Vitals and nursing note reviewed.  Constitutional:      General: She is not in acute distress.    Appearance: She is ill-appearing.     Comments: Thin frail chronically ill adult female  HENT:     Head: Normocephalic and atraumatic.     Right Ear: External ear normal.     Left Ear: External ear normal.     Nose: Nose normal. No congestion or rhinorrhea.     Mouth/Throat:     Mouth: Mucous membranes are moist.     Pharynx: Oropharynx is clear.  Eyes:     Pupils: Pupils are equal, round, and reactive to light.  Cardiovascular:     Rate and Rhythm: Normal rate and regular rhythm.     Pulses: Normal pulses.     Heart sounds: Normal heart sounds. No murmur.  Pulmonary:     Effort: Pulmonary effort is normal. No respiratory distress.     Breath sounds: No decreased air movement. Wheezing present. No decreased breath sounds or rales.  Musculoskeletal:     Cervical back: Normal range of motion.     Right lower leg: No edema.     Left lower leg: No edema.  Skin:    General: Skin is warm and dry.     Capillary Refill: Capillary refill takes less than 2 seconds.  Neurological:     General: No focal deficit present.     Mental Status: She is alert and oriented to person, place, and time. Mental status is at baseline.     Gait: Gait normal.  Psychiatric:        Mood and Affect: Mood normal.        Behavior: Behavior normal.        Thought Content: Thought content normal.        Judgment: Judgment normal.       Assessment & Plan:   COPD (chronic obstructive pulmonary disease) ([place]) Plan: Doxycycline day Prednisone taper Lab work today: CBC with differential, c-Met, alpha-1 with phenotype Stop Spiriva HandiHaler Start Trelegy Ellipta 100, samples provided today Continue rescue inhaler, albuterol nebs as needed for shortness of breath or wheezing Emphasized need for the patient to stop smoking Close follow-up with our  office in 2 weeks Establish with new pulmonologist in January/2021 Ordered pulmonary function testing Ordered repeat overnight oximetry Ordered CT of chest to follow pulmonary nodules 2019  Nocturnal hypoxemia Unfortunately patient will need repeat ono  Plan: Repeat overnight oximetry on room air Patient will likely qualify for 2 L of O2 at night  Abnormal findings on diagnostic imaging of lung Plan: We will repeat CT chest sometime over the next 4 weeks Emphasized need for the patient to stop smoking  Current tobacco use Plan: Emphasized need for the patient to stop smoking We will schedule patient for telephonic follow-up with clinical pharmacy team to help with smoking cessation  Medication management We will stop Spiriva HandiHaler today We will start Trelegy Ellipta 100, samples provided today  2-week follow-up with our office to see how patient is doing with Trelegy Ellipta    Return in about 2 weeks (around 02/26/2019), or if symptoms worsen or fail to improve, for Follow up with Tammy Parrett  ANP-BC, Follow up with Wyn Quaker FNP-C.   Lauraine Rinne, NP 02/12/2019   This appointment was 45 minutes long with over 50% of the time in direct face-to-face patient care, assessment, plan  of care, and follow-up. 

## 2019-02-12 NOTE — Patient Instructions (Addendum)
You were seen today by Lauraine Rinne, NP  for:   1. Pulmonary emphysema, unspecified emphysema type ([place])  - Pulse oximetry, overnight; Future - Pulmonary Function Test [facility] Only; Future  Prednisone 87m tablet  >>>4 tabs for 2 days, then 3 tabs for 2 days, 2 tabs for 2 days, then 1 tab for 2 days, then stop >>>take with food  >>>take in the morning   Doxycycline >>> 1 100 mg tablet every 12 hours for 7 days >>>take with food  >>>wear sunscreen   Lab work today: Alpha-1 with phenotype, CBC with differential, c-Met  Stop Spiriva HandiHaler  Start:  Trelegy Ellipta  >>> 1 puff daily in the morning >>>rinse mouth out after use  >>> This inhaler contains 3 medications that help manage her respiratory status, contact our office if you cannot afford this medication or unable to remain on this medication  Only use your albuterol as a rescue medication to be used if you can't catch your breath by resting or doing a relaxed purse lip breathing pattern.  - The less you use it, the better it will work when you need it. - Ok to use up to 2 puffs  every 4 hours if you must but call for immediate appointment if use goes up over your usual need - Don't leave home without it !!  (think of it like the spare tire for your car)   Note your daily symptoms > remember "red flags" for COPD:   >>>Increase in cough >>>increase in sputum production >>>increase in shortness of breath or activity  intolerance.   If you notice these symptoms, please call the office to be seen.   2. Current tobacco use  We recommend that you stop smoking.  >>>You need to set a quit date >>>If you have friends or family who smoke, let them know you are trying to quit and not to smoke around you or in your living environment  Smoking Cessation Resources:  1 800 QUIT NOW  >>> Patient to call this resource and utilize it to help support her quit smoking >>> Keep up your hard work with stopping smoking  You can also  contact the [facility]>>>For smoking cessation classes call [phone] We do not recommend using e-cigarettes as a form of stopping smoking  You can sign up for smoking cessation support texts and information:  >>>https://smokefree.gov/smokefreetxt  Please present to our office in 1 weeks for an appointment with the clinical pharmacy team for:  . Smoking cessation . Medication Access - Trelegy    3. Nocturnal hypoxemia  We will repeat an overnight oximetry test on room air to justify ordering oxygen for you at night  4. Abnormal findings on diagnostic imaging of lung  We will repeat CT imaging of you sometime within the next 4 weeks   We recommend today:  Orders Placed This Encounter  Procedures  . CT CHEST WO CONTRAST    Standing Status:   Future    Standing Expiration Date:   04/14/2020    Scheduling Instructions:     4 weeks    Order Specific Question:   Is patient pregnant?    Answer:   No    Order Specific Question:   Preferred imaging location?    Answer:   [facility]    Order Specific Question:   Radiology Contrast Protocol - do NOT remove file path    Answer:   [email or web address]  . Pulse oximetry, overnight  On room air    Standing Status:   Future    Standing Expiration Date:   02/12/2020  . Pulmonary Function Test [facility] Only    Standing Status:   Future    Standing Expiration Date:   02/12/2020    Order Specific Question:   Full PFT: includes the following: basic spirometry, spirometry pre & post bronchodilator, diffusion capacity (DLCO), lung volumes    Answer:   Full PFT   Orders Placed This Encounter  Procedures  . CT CHEST WO CONTRAST  . Pulse oximetry, overnight  . Pulmonary Function Test [facility] Only   Meds ordered this encounter  Medications  . predniSONE (DELTASONE) 10 MG tablet    Sig: Take 4 tabs x 2d, then 3 tabs x 2d, 2 tabs x 2d, 1 tab x 2 days and stop.    Dispense:  20 tablet    Refill:   0  . Fluticasone-Umeclidin-Vilant (TRELEGY ELLIPTA) 100-62.5-25 MCG/INH AEPB    Sig: Inhale 1 puff into the lungs daily for 1 day.    Dispense:  28 each    Refill:  0    Order Specific Question:   Lot Number?    Answer:   [id number]    Order Specific Question:   Expiration Date?    Answer:   06/02/2020    Order Specific Question:   Manufacturer?    Answer:   Gl[employer]SmithK[employer] [12]    Order Specific Question:   Quantity    Answer:   2  . doxycycline (VIBRA-TABS) 100 MG tablet    Sig: Take 1 tablet (100 mg total) by mouth 2 (two) times daily.    Dispense:  14 tablet    Refill:  0    Follow Up:    Return in about 2 weeks (around 02/26/2019), or if symptoms worsen or fail to improve, for Follow up with Tammy Parrett  ANP-BC, Follow up with Wyn Quaker FNP-C.  Also establish with either Dr. Mortimer Fries or Dr. Patsey Berthold in January/2021 >>> 34mn OV former DR pt   Please do your part to reduce the spread of COVID-19:      Reduce your risk of any infection  and COVID19 by using the similar precautions used for avoiding the common cold or flu:  .Marland KitchenWash your hands often with soap and warm water for at least 20 seconds.  If soap and water are not readily available, use an alcohol-based hand sanitizer with at least 60% alcohol.  . If coughing or sneezing, cover your mouth and nose by coughing or sneezing into the elbow areas of your shirt or coat, into a tissue or into your sleeve (not your hands). .Langley GaussA MASK when in public  . Avoid shaking hands with others and consider head nods or verbal greetings only. . Avoid touching your eyes, nose, or mouth with unwashed hands.  . Avoid close contact with people who are sick. . Avoid places or events with large numbers of people in one location, like concerts or sporting events. . If you have some symptoms but not all symptoms, continue to monitor at home and seek medical attention if your symptoms worsen. . If you are having a medical emergency, call  911.   [place]/ e-Visit: [email or web address]        [facility]: [facility] [phone]  [facility]: [phone]     It is flu season:   >>> Best ways to protect herself from the flu: Receive the yearly flu vaccine, practice good hand hygiene washing with soap and also using hand sanitizer when available, eat a nutritious meals, get adequate rest, hydrate appropriately   Please contact the office if your symptoms worsen or you have concerns that you are not improving.   Thank you for choosing [facility] for your healthcare, and for allowing [place] to partner with you on your healthcare journey. I am thankful to be able to provide care to you today.   Wyn Quaker FNP-C    Health Risks of Smoking Smoking cigarettes is very bad for your health. Tobacco smoke has over 200 known poisons in it. It contains the poisonous gases nitrogen oxide and carbon monoxide. There are over 60 chemicals in tobacco smoke that cause cancer. Smoking is difficult to quit because a chemical in tobacco, called nicotine, causes addiction or dependence. When you smoke and inhale, nicotine is absorbed rapidly into the bloodstream through your lungs. Both inhaled and non-inhaled nicotine may be addictive. What are the risks of cigarette smoke? Cigarette smokers have an increased risk of many serious medical problems, including:  Lung cancer.  Lung disease, such as pneumonia, bronchitis, and emphysema.  Chest pain (angina) and heart attack because the heart is not getting enough oxygen.  Heart disease and peripheral blood vessel disease.  High blood pressure (hypertension).  Stroke.  Oral cancer, including cancer of the lip, mouth, or voice box.  Bladder cancer.  Pancreatic cancer.  Cervical cancer.  Pregnancy  complications, including premature birth.  Stillbirths and smaller newborn babies, birth defects, and genetic damage to sperm.  Early menopause.  Lower estrogen level for women.  Infertility.  Facial wrinkles.  Blindness.  Increased risk of broken bones (fractures).  Senile dementia.  Stomach ulcers and internal bleeding.  Delayed wound healing and increased risk of complications during surgery.  Even smoking lightly shortens your life expectancy by several years. Because of secondhand smoke exposure, children of smokers have an increased risk of the following:  Sudden infant death syndrome (SIDS).  Respiratory infections.  Lung cancer.  Heart disease.  Ear infections. What are the benefits of quitting? There are many health benefits of quitting smoking. Here are some of them:  Within days of quitting smoking, your risk of having a heart attack decreases, your blood flow improves, and your lung capacity improves. Blood pressure, pulse rate, and breathing patterns start returning to normal soon after quitting.  Within months, your lungs may clear up completely.  Quitting for 10 years reduces your risk of developing lung cancer and heart disease to almost that of a nonsmoker.  People who quit may see an improvement in their overall quality of life. How do I quit smoking?     Smoking is an addiction with both physical and psychological effects, and longtime habits can be hard to change. Your health care provider can recommend:  Programs and community resources, which may include group support, education, or talk therapy.  Prescription medicines to help reduce cravings.  Nicotine replacement products, such as patches, gum, and nasal sprays. Use these products only as directed. Do not replace cigarette smoking with electronic cigarettes, which are commonly called e-cigarettes. The safety of e-cigarettes is not known, and some may contain harmful chemicals.  A  combination of two or more of these methods. Where to find more information  American  Lung Association: www.lung.org  American Cancer Society: www.cancer.org Summary  Smoking cigarettes is very bad for your health. Cigarette smokers have an increased risk of many serious medical problems, including several cancers, heart disease, and stroke.  Smoking is an addiction with both physical and psychological effects, and longtime habits can be hard to change.  By stopping right away, you can greatly reduce the risk of medical problems for you and your family.  To help you quit smoking, your health care provider can recommend programs, community resources, prescription medicines, and nicotine replacement products such as patches, gum, and nasal sprays. This information is not intended to replace advice given to you by your health care provider. Make sure you discuss any questions you have with your health care provider. Document Released: 03/28/2004 Document Revised: 05/22/2017 Document Reviewed: 02/23/2016 Elsevier Patient Education  2020 [employer].

## 2019-02-12 NOTE — Assessment & Plan Note (Signed)
Plan: We will repeat CT chest sometime over the next 4 weeks Emphasized need for the patient to stop smoking

## 2019-02-12 NOTE — Assessment & Plan Note (Signed)
Unfortunately patient will need repeat ono  Plan: Repeat overnight oximetry on room air Patient will likely qualify for 2 L of O2 at night

## 2019-02-12 NOTE — Assessment & Plan Note (Addendum)
Plan: Doxycycline day Prednisone taper Lab work today: CBC with differential, c-Met, alpha-1 with phenotype Stop Spiriva HandiHaler Start Trelegy Ellipta 100, samples provided today Continue rescue inhaler, albuterol nebs as needed for shortness of breath or wheezing Emphasized need for the patient to stop smoking Close follow-up with our office in 2 weeks Establish with new pulmonologist in January/2021 Ordered pulmonary function testing Ordered repeat overnight oximetry Ordered CT of chest to follow pulmonary nodules 2019

## 2019-02-17 LAB — ALPHA-1 ANTITRYPSIN PHENOTYPE: A-1 Antitrypsin, Ser: 94 mg/dL — ABNORMAL LOW (ref 101–187)

## 2019-02-19 ENCOUNTER — Encounter: Payer: Self-pay | Admitting: Pulmonary Disease

## 2019-02-23 ENCOUNTER — Telehealth: Payer: Self-pay | Admitting: Pharmacy Technician

## 2019-02-23 NOTE — Telephone Encounter (Signed)
Called patient to schedule virtual appointment with the pharmacy team for smoking cessation  per request from [facility].  Left message with family member to have patient call back to schedule..  Patient can be placed on any open slot on the pharmacy schedule.  Thank you!  Beatriz Chancellor, CPhT 1:58 PM

## 2019-02-23 NOTE — Progress Notes (Signed)
Virtual Visit via Telephone Note  12/23 - 1004, attempt, #[id number], unable to leave message, vm full  12/23 - 1004, attempt #1547, busy  12/23 - 1006, attempt #5419, no vm 12/23 - 1017, attempt #5419, no vm  12/23 - 1018, attempt #5138, unable to leave vm  12/23 - 10/18, attempt # 1547, LEFT MESSAGE Mychart message sent  Letter mailed to pt earlier this week    I attempted to connect with [place] on 02/24/19 at 10:30 AM EST .  I was unsuccessful see notes above.  History of Present Illness:  53 year old female current smoker followed in our office for COPD  PMH: Long-term opiate use, mood disorder, BRCA positive, chronic pain Smoker/ Smoking History: Current smoker.  0.5 packs/day.  80-pack-year smoking history Maintenance: Spiriva Handihaler   Pt of: Former Pt of Dr. Ashby Dawes  Chief complaint: 2-week follow-up, last seen 02/12/2019  53 year old female current everyday smoker followed in our [place] office.  At last office visit on 02/12/2019 patient was treated for a COPD exacerbation, emphasized to stop smoking.  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  Upcoming CT is scheduled for 03/15/2019.    Unable to reach patient.  6 attempts were made.  Letter was mailed earlier this week.  MyChart message sent.  Documented telephone note in chart.  Observations/Objective:  02/02/2019-alpha-1 94, MZ phenotype  07/26/2017-CTA chest-no PE, 4 to 5 mm nodular opacities left lower lobe, recommending follow-up in 12 months.  Underlying centrilobular and paraseptal emphysema  11/11/2018-home sleep study-AHI 1.5, SaO2 low 81%  10/09/2018-overnight oximetry on room air-total time sleeping 9 hours and 7 minutes, time less than 88% 380 minutes   Social History   Tobacco Use  Smoking Status Current Every Day  Smoker  . Packs/day: 2.00  . Years: 40.00  . Pack years: 80.00  . Types: Cigarettes  . Start date: 03/04/1978  Smokeless Tobacco Never Used  Tobacco Comment   started decreasing down in 2015, 0.5 pack daily- 02/12/2019   Immunization History  Administered Date(s) Administered  . Influenza,inj,Quad PF,6+ Mos 04/28/2016, 11/07/2018  . Pneumococcal Polysaccharide-23 04/28/2016  . Tdap 03/22/2013      Assessment and Plan:  No problem-specific Assessment & Plan notes found for this encounter.   Follow Up Instructions:  Return if symptoms worsen or fail to improve, for [facility] - Dr. Mortimer Fries, [facility] - Dr. Patsey Berthold.   I discussed the assessment and treatment plan with the patient. The patient was provided an opportunity to ask questions and all were answered. The patient agreed with the plan and demonstrated an understanding of the instructions.   The patient was advised to call back or seek an in-person evaluation if the symptoms worsen or if the condition fails to improve as anticipated.  I provided 0 minutes of non-face-to-face time during this encounter.  Unsuccessful attempts trying to reach the patient.  6 attempts were made.  1 voicemail was left.  My chart message personally sent by the provider.  AVS mailed document the patient needs to contact her office.  Letter was mailed earlier this week document the patient needed to contact our office.   Lauraine Rinne, NP

## 2019-02-24 ENCOUNTER — Other Ambulatory Visit: Payer: Self-pay

## 2019-02-24 ENCOUNTER — Encounter: Payer: Self-pay | Admitting: Pulmonary Disease

## 2019-02-24 ENCOUNTER — Telehealth: Payer: Self-pay | Admitting: Pulmonary Disease

## 2019-02-24 ENCOUNTER — Ambulatory Visit (INDEPENDENT_AMBULATORY_CARE_PROVIDER_SITE_OTHER): Payer: 59 | Admitting: Pulmonary Disease

## 2019-02-24 DIAGNOSIS — Z79899 Other long term (current) drug therapy: Secondary | ICD-10-CM

## 2019-02-24 DIAGNOSIS — Z720 Tobacco use: Secondary | ICD-10-CM

## 2019-02-24 DIAGNOSIS — R918 Other nonspecific abnormal finding of lung field: Secondary | ICD-10-CM

## 2019-02-24 DIAGNOSIS — J439 Emphysema, unspecified: Secondary | ICD-10-CM

## 2019-02-24 NOTE — Telephone Encounter (Signed)
02/24/2019 1026  I have attempted to reach the patient 6 times this morning.  I have left a voicemail at the #[phone] number.  I have also personally sent a MyChart message to the patient.  The information is listed below:  Shelby Wang,  I have personally try to reach you 6 times this morning.  I have attempted all 3 numbers in your chart.  I was only able to leave a voicemail at the #[phone] number.  We also have mailed a letter to you earlier this week please were unable to reach you regarding your testing results.  Please contact our [place] office: [phone] >>> You need a 30-minute office visit to establish with a new pulmonary provider >>> We need to ensure that you complete your pulmonary function testing as ordered at last office visit >>> You need to complete your CT as scheduled in January/2021  Please also contact our [place] office: [phone] >>> You need to have a telephonic visit with the clinical pharmacy team they have attempted to reach you, they are here Monday mornings and Fridays during the day for your smoking cessation >>> You can reschedule the telephonic visit that you had scheduled with me today >>> You need to update your contact information as these are not reliable numbers  It is very important that you contact both of these numbers above and complete the tasks listed above.  Wyn Quaker FNP  [facility]   I will route this information to [facility] as an FYI in case the patient reaches the [place] office.  I will also route this to Apolonio Schneiders as she had personally, and tried to contact the patient to schedule a telephonic clinical pharmacy team appointment for smoking cessation.  If the patient contacts either the [place] office or the [place] office she should be scheduled for a follow-up appointment as well as a 30-minute office slot to establish with a new [place] pulmonologist.  She should also have a televisit scheduled with myself.  She needs  to also have her contact information updated in the chart as we are having significant difficulties with reaching her which is a major barrier to her care.  Wyn Quaker FNP

## 2019-02-24 NOTE — Telephone Encounter (Signed)
Noted  

## 2019-02-24 NOTE — Telephone Encounter (Signed)
Patient called the [facility] office but I was on the phone with another patient. Was provided a new number to contact the patient, [phone]. Called the number at least 5 times but received a message stating "call can not be completed as dialed" each time.   Will add this number to her chart and continue to call her.

## 2019-02-24 NOTE — Patient Instructions (Addendum)
You were supposed to be seen today by Lauraine Rinne, NP  for:   1. Pulmonary emphysema, unspecified emphysema type ([place]) 2. Abnormal findings on diagnostic imaging of lung 3. Current tobacco use 4. Medication management  We were unsuccessful with trying to reach you today for a televisit.  You need to contact the numbers below.  Please contact our [place] office: [phone] >>> You need a 30-minute office visit to establish with a new pulmonary provider >>> We need to ensure that you complete your pulmonary function testing as ordered at last office visit >>> You need to complete your CT as scheduled in January/2021  Please also contact our [place] office: [phone] >>> You need to have a telephonic visit with the clinical pharmacy team they have attempted to reach you, they are here Monday mornings and Fridays during the day for your smoking cessation >>> You can reschedule the telephonic visit that you had scheduled with me today >>> You need to update your contact information as these are not reliable numbers    Follow Up:    Return if symptoms worsen or fail to improve, for [facility] - Dr. Mortimer Fries, [facility] - Dr. Patsey Berthold.   Please do your part to reduce the spread of COVID-19:      Reduce your risk of any infection  and COVID19 by using the similar precautions used for avoiding the common cold or flu:  Marland Kitchen Wash your hands often with soap and warm water for at least 20 seconds.  If soap and water are not readily available, use an alcohol-based hand sanitizer with at least 60% alcohol.  . If coughing or sneezing, cover your mouth and nose by coughing or sneezing into the elbow areas of your shirt or coat, into a tissue or into your sleeve (not your hands). Langley Gauss A MASK when in public  . Avoid shaking hands with others and consider head nods or verbal greetings only. . Avoid touching your eyes, nose, or mouth with unwashed hands.  . Avoid close contact  with people who are sick. . Avoid places or events with large numbers of people in one location, like concerts or sporting events. . If you have some symptoms but not all symptoms, continue to monitor at home and seek medical attention if your symptoms worsen. . If you are having a medical emergency, call 911.   [place] / e-Visit: [email or web address]         [facility]: [facility]: [id number]                   [facility]: [id number]     It is flu season:   >>> Best ways to protect herself from the flu: Receive the yearly flu vaccine, practice good hand hygiene washing with soap and also using hand sanitizer when available, eat a nutritious meals, get adequate rest, hydrate appropriately   Please contact the office if your symptoms worsen or you have concerns that you are not improving.   Thank you for choosing [facility] for your healthcare, and for allowing [place] to partner with you on your healthcare journey. I am thankful to be able to provide care to you today.   Wyn Quaker FNP-C

## 2019-02-25 ENCOUNTER — Telehealth: Payer: Self-pay | Admitting: Pulmonary Disease

## 2019-02-25 NOTE — Telephone Encounter (Signed)
02/25/2019 1026  Patient's overnight oximetry results have been received.  Overnight oximetry results are listed below:  02/19/2019-overnight oximetry-patient slept 11 hours, 564 minutes of that time was below 88%.    Patient needs to start 2 L of O2 and likely will require a repeat sleep study.    This can be further reviewed when patient established care with Dr. Patsey Berthold.  Patient has already had a Home sleep test in September/2020.  This was negative for sleep apnea.  But was positive for oxygen desaturations.  Routing to [place] for follow up.   Wyn Quaker FNP

## 2019-02-25 NOTE — Telephone Encounter (Signed)
Lm for pt on mobile number on file.  

## 2019-03-02 NOTE — Telephone Encounter (Signed)
ATC pt- unable to leave vm, due to mailbox being full.

## 2019-03-02 NOTE — Telephone Encounter (Signed)
Noted, I am unable to render opinion as of yet thus I have not seen her.

## 2019-03-03 NOTE — Telephone Encounter (Signed)
ATC mobile number on file- received recording that number is not in service.  Unable to leave vm on home number on file as mailbox is full. Letter has been sent to address on file.

## 2019-03-08 ENCOUNTER — Telehealth: Payer: Self-pay

## 2019-03-08 NOTE — Telephone Encounter (Addendum)
ATC pt on home number on file to relay date/time of covid test. Unable to leave vm, due to mailbox being full. ATC mobile number on file and received recording that number is not in service.  03/10/2019 prior to 11:[facility]

## 2019-03-09 NOTE — Telephone Encounter (Signed)
Left message on home number on file.  Mobile number is not in service.

## 2019-03-10 ENCOUNTER — Other Ambulatory Visit: Payer: Self-pay

## 2019-03-10 ENCOUNTER — Other Ambulatory Visit
Admission: RE | Admit: 2019-03-10 | Discharge: 2019-03-10 | Disposition: A | Discharge status: Home / Self Care | Payer: 59 | Source: Ambulatory Visit | Attending: Pulmonary Disease | Admitting: Pulmonary Disease

## 2019-03-10 DIAGNOSIS — Z01812 Encounter for preprocedural laboratory examination: Secondary | ICD-10-CM | POA: Diagnosis present

## 2019-03-10 DIAGNOSIS — Z20822 Contact with and (suspected) exposure to covid-19: Secondary | ICD-10-CM | POA: Diagnosis not present

## 2019-03-10 LAB — SARS CORONAVIRUS 2 (TAT 6-24 HRS): SARS Coronavirus 2: NEGATIVE

## 2019-03-10 NOTE — Telephone Encounter (Signed)
Per pt's chart it appears that she did show for covid test.

## 2019-03-11 ENCOUNTER — Other Ambulatory Visit: Payer: Self-pay

## 2019-03-11 ENCOUNTER — Ambulatory Visit: Payer: 59 | Attending: Pulmonary Disease

## 2019-03-11 ENCOUNTER — Telehealth: Payer: Self-pay | Admitting: Pulmonary Disease

## 2019-03-11 DIAGNOSIS — F17210 Nicotine dependence, cigarettes, uncomplicated: Secondary | ICD-10-CM | POA: Diagnosis not present

## 2019-03-11 DIAGNOSIS — J439 Emphysema, unspecified: Secondary | ICD-10-CM

## 2019-03-11 MED ORDER — ALBUTEROL SULFATE (2.5 MG/3ML) 0.083% IN NEBU
2.5000 mg | INHALATION_SOLUTION | Freq: Once | RESPIRATORY_TRACT | Status: AC
  Administered 2019-03-11: 10:00:00 2.5 mg via RESPIRATORY_TRACT
  Filled 2019-03-11: qty 3

## 2019-03-11 NOTE — Telephone Encounter (Signed)
Lauraine Rinne, NP  02/17/2019 10:16 PM EST    Patient's blood work shows alpha 1 antitrypsin deficiency with a phenotype of MZ. Please move up patient's appointment from 02/24/2019 to an earlier time slot so that I can discuss this with the patient.  This is fine to be a video visit, a televisit or an in office visit in the [place] clinic. I would prefer either a video visit or in person visit. What ever is the patient's preference.  Wyn Quaker, FNP     Lauraine Rinne, NP  10:28 AM Note   02/25/2019 1026  Patient's overnight oximetry results have been received.  Overnight oximetry results are listed below:  02/19/2019-overnight oximetry-patient slept 11 hours, 564 minutes of that time was below 88%.    Patient needs to start 2 L of O2 and likely will require a repeat sleep study.    This can be further reviewed when patient established care with Dr. Patsey Berthold.  Patient has already had a Home sleep test in September/2020.  This was negative for sleep apnea.  But was positive for oxygen desaturations.  Routing to [place] for follow up.   Wyn Quaker FNP         Pt is aware of results and voiced her understanding. Pt has upcoming appt with LG on 03/18/2019 and will discuss results further during this visit. Order for 2L has been placed, as pt was agreeable.  Nothing further is needed.

## 2019-03-11 NOTE — Telephone Encounter (Signed)
ATC provided contact number- number is not in service.  Lm on home number on file.

## 2019-03-11 NOTE — Telephone Encounter (Signed)
Pt returned call. She prefers to be called on her cell phone [phone]

## 2019-03-15 ENCOUNTER — Other Ambulatory Visit: Payer: Self-pay

## 2019-03-15 ENCOUNTER — Ambulatory Visit
Admission: RE | Admit: 2019-03-15 | Discharge: 2019-03-15 | Disposition: A | Discharge status: Home / Self Care | Payer: 59 | Source: Ambulatory Visit | Attending: Pulmonary Disease | Admitting: Pulmonary Disease

## 2019-03-15 DIAGNOSIS — R911 Solitary pulmonary nodule: Secondary | ICD-10-CM | POA: Diagnosis present

## 2019-03-18 ENCOUNTER — Other Ambulatory Visit: Payer: Self-pay

## 2019-03-18 ENCOUNTER — Ambulatory Visit: Payer: 59 | Admitting: Pulmonary Disease

## 2019-03-18 ENCOUNTER — Encounter: Payer: Self-pay | Admitting: Pulmonary Disease

## 2019-03-18 VITALS — BP 122/64 | HR 74 | Temp 97.4°F | Ht <= 58 in | Wt 153.6 lb

## 2019-03-18 DIAGNOSIS — Z720 Tobacco use: Secondary | ICD-10-CM

## 2019-03-18 DIAGNOSIS — Z79899 Other long term (current) drug therapy: Secondary | ICD-10-CM

## 2019-03-18 DIAGNOSIS — J441 Chronic obstructive pulmonary disease with (acute) exacerbation: Secondary | ICD-10-CM

## 2019-03-18 MED ORDER — AZITHROMYCIN 250 MG PO TABS: ORAL_TABLET | ORAL | 0 refills | Status: AC

## 2019-03-18 MED ORDER — BREZTRI AEROSPHERE 160-9-4.8 MCG/ACT IN AERO: 2.0000 | INHALATION_SPRAY | Freq: Two times a day (BID) | RESPIRATORY_TRACT | 0 refills | Status: DC

## 2019-03-18 MED ORDER — PREDNISONE 10 MG (21) PO TBPK: ORAL_TABLET | ORAL | 0 refills | Status: DC

## 2019-03-18 NOTE — Patient Instructions (Addendum)
1.  You are using your nebulizer too much and this can cause issues with your heart which can in turn worsen your shortness of breath.  2.  We will switch your Trelegy Ellipta to Breztri 2 puffs twice a day to see if this controls your symptoms better during the day.  Please let us know how this is doing so we can call in the prescription for you if it does.  3.  We will give you a prednisone taper pack and a Z-Pak.  4.  We will see you in follow-up in 4 to 6 weeks time.  At that time we will determine if we need to do a heart test.

## 2019-03-18 NOTE — Progress Notes (Signed)
 Assessment & Plan:  1. COPD with acute exacerbation (HCC) (Primary)  2. Medication management  3. Current tobacco use   Patient Instructions  1.  You are using your nebulizer too much and this can cause issues with your heart which can in turn worsen your shortness of breath.  2.  We will switch your Trelegy Ellipta  to Breztri  2 puffs twice a day to see if this controls your symptoms better during the day.  Please let us  know how this is doing so we can call in the prescription for you if it does.  3.  We will give you a prednisone  taper pack and a Z-Pak.  4.  We will see you in follow-up in 4 to 6 weeks time.  At that time we will determine if we need to do a heart test.    Please note: late entry documentation due to logistical difficulties during COVID-19 pandemic. This note is filed for information purposes only, and is not intended to be used for billing, nor does it represent the full scope/nature of the visit in question. Please see any associated scanned media linked to date of encounter for additional pertinent information.  Subjective:    HPI: Shelby Wang is a 54 y.o. female presenting to the pulmonology clinic on 03/18/2019 with report of: Follow-up (Pt states is has been really hard to breathe and seems as if breathing has gotten worse. Stated she has been have back pain In her lungs. Productive cough with greem phlegm. Denies any fevers but has occ. chills.)     Outpatient Encounter Medications as of 03/18/2019  Medication Sig Note   albuterol  (PROVENTIL ) (2.5 MG/3ML) 0.083% nebulizer solution 2.5 mg. 10/11/2015: Received from: [facility] Received Sig: Inhale 3 mL (2.5 mg total) by nebulization every six (6) hours as needed for wheezing.   buprenorphine-naloxone (SUBOXONE) 8-2 mg SUBL SL tablet Place 1 tablet under the tongue 3 (three) times daily.    clonazePAM (KLONOPIN) 0.5 MG tablet Take by mouth.    LYRICA 150 MG capsule TAKE 1 CAPSULE BY MOUTH TWICE A  DAY FOR PAIN 10/11/2015: Received from: External Pharmacy   sertraline (ZOLOFT) 100 MG tablet Take 100 mg by mouth daily.    traZODone (DESYREL) 100 MG tablet Take 100 mg by mouth at bedtime.    [DISCONTINUED] PROAIR  HFA 108 (90 Base) MCG/ACT inhaler     [EXPIRED] azithromycin  (ZITHROMAX  Z-PAK) 250 MG tablet Take 2 tablets (500 mg) on  Day 1,  followed by 1 tablet (250 mg) once daily on Days 2 through 5.    [DISCONTINUED] albuterol -ipratropium (COMBIVENT) 18-103 MCG/ACT inhaler Inhale into the lungs. 10/11/2015: Received from: [facility] Received Sig: Inhale 1 puff Four (4) times a day.   [DISCONTINUED] Budeson-Glycopyrrol-Formoterol (BREZTRI  AEROSPHERE) 160-9-4.8 MCG/ACT AERO Inhale 2 puffs into the lungs 2 (two) times daily.    [DISCONTINUED] doxycycline  (VIBRA -TABS) 100 MG tablet Take 1 tablet (100 mg total) by mouth 2 (two) times daily. (Patient not taking: Reported on 03/18/2019)    [DISCONTINUED] omeprazole  (PRILOSEC) 20 MG capsule Take 1 capsule (20 mg total) by mouth daily. (Patient not taking: Reported on 03/18/2019)    [DISCONTINUED] predniSONE  (DELTASONE ) 10 MG tablet Take 4 tabs x 2d, then 3 tabs x 2d, 2 tabs x 2d, 1 tab x 2 days and stop. (Patient not taking: Reported on 03/18/2019)    [DISCONTINUED] predniSONE  (STERAPRED UNI-PAK 21 TAB) 10 MG (21) TBPK tablet Take as directed on package    [  DISCONTINUED] tiotropium (SPIRIVA) 18 MCG inhalation capsule Place into inhaler and inhale. 10/11/2015: Received from: [facility] Received Sig: Place 18 mcg into inhaler and inhale daily.   No facility-administered encounter medications on file as of 03/18/2019.      Objective:   Vitals:   03/18/19 1502  BP: 122/64  Pulse: 74  Temp: (!) 97.4 F (36.3 C)  Height: 4' 9 (1.448 m)  Weight: 153 lb 9.6 oz (69.7 kg)  SpO2: 98% Comment: ON RA  TempSrc: Temporal  BMI (Calculated): 33.23     Physical exam documentation is limited by delayed entry of information.

## 2019-04-28 ENCOUNTER — Ambulatory Visit: Payer: 59 | Admitting: Pulmonary Disease

## 2019-04-28 ENCOUNTER — Other Ambulatory Visit: Payer: Self-pay

## 2019-04-28 ENCOUNTER — Encounter: Payer: Self-pay | Admitting: Pulmonary Disease

## 2019-04-28 VITALS — BP 128/74 | HR 83 | Temp 97.8°F | Ht <= 58 in | Wt 147.0 lb

## 2019-04-28 DIAGNOSIS — J418 Mixed simple and mucopurulent chronic bronchitis: Secondary | ICD-10-CM

## 2019-04-28 DIAGNOSIS — G4734 Idiopathic sleep related nonobstructive alveolar hypoventilation: Secondary | ICD-10-CM

## 2019-04-28 MED ORDER — IPRATROPIUM-ALBUTEROL 0.5-2.5 (3) MG/3ML IN SOLN: 3.0000 mL | Freq: Four times a day (QID) | RESPIRATORY_TRACT | 6 refills | Status: DC | PRN

## 2019-04-28 NOTE — Patient Instructions (Addendum)
1.  Recommend to switch your nebulizers to DuoNeb (ipratropium/albuterol) 4 times a day.  The prescription will say "as needed" however take it 4 times a day regularly.  2.  May use your albuterol inhaler if needed for shortness of breath.  3.  We will see you in follow-up in 2 months time.

## 2019-04-28 NOTE — Progress Notes (Signed)
    Assessment & Plan:  1. Mixed simple and mucopurulent chronic bronchitis (HCC) (Primary)  2. Nocturnal hypoxemia   Patient Instructions  1.  Recommend to switch your nebulizers to DuoNeb (ipratropium/albuterol ) 4 times a day.  The prescription will say as needed however take it 4 times a day regularly.  2.  May use your albuterol  inhaler if needed for shortness of breath.  3.  We will see you in follow-up in 2 months time.  Please note: late entry documentation due to logistical difficulties during COVID-19 pandemic. This note is filed for information purposes only, and is not intended to be used for billing, nor does it represent the full scope/nature of the visit in question. Please see any associated scanned media linked to date of encounter for additional pertinent information.  Subjective:    HPI: Shelby Wang is a 54 y.o. female presenting to the pulmonology clinic on 04/28/2019 with report of: Follow-up (recent ED visit 04/23/2019. completed course of abx and prednisone  with some improvement in breathing. c/o sob with exertion, dry cough at times prod with greenish mucus and wheezing)     Outpatient Encounter Medications as of 04/28/2019  Medication Sig Note   albuterol  (PROVENTIL ) (2.5 MG/3ML) 0.083% nebulizer solution 2.5 mg. 10/11/2015: Received from: [facility] Received Sig: Inhale 3 mL (2.5 mg total) by nebulization every six (6) hours as needed for wheezing.   buprenorphine-naloxone (SUBOXONE) 8-2 mg SUBL SL tablet Place 1 tablet under the tongue 3 (three) times daily.    clonazePAM (KLONOPIN) 0.5 MG tablet Take by mouth.    LYRICA 150 MG capsule TAKE 1 CAPSULE BY MOUTH TWICE A DAY FOR PAIN 10/11/2015: Received from: External Pharmacy   sertraline (ZOLOFT) 100 MG tablet Take 100 mg by mouth daily.    traZODone (DESYREL) 100 MG tablet Take 100 mg by mouth at bedtime.    [DISCONTINUED] predniSONE  (STERAPRED UNI-PAK 21 TAB) 10 MG (21) TBPK tablet Take as directed on  package    [DISCONTINUED] PROAIR  HFA 108 (90 Base) MCG/ACT inhaler     [DISCONTINUED] Budeson-Glycopyrrol-Formoterol (BREZTRI  AEROSPHERE) 160-9-4.8 MCG/ACT AERO Inhale 2 puffs into the lungs 2 (two) times daily.    [DISCONTINUED] ipratropium-albuterol  (DUONEB) 0.5-2.5 (3) MG/3ML SOLN Take 3 mLs by nebulization every 6 (six) hours as needed.    No facility-administered encounter medications on file as of 04/28/2019.      Objective:   Vitals:   04/28/19 1557  BP: 128/74  Pulse: 83  Temp: 97.8 F (36.6 C)  Height: 4' 9 (1.448 m)  Weight: 147 lb (66.7 kg)  SpO2: 94%  TempSrc: Temporal  BMI (Calculated): 31.8     Physical exam documentation is limited by delayed entry of information.

## 2019-06-15 ENCOUNTER — Telehealth: Payer: Self-pay | Admitting: Pulmonary Disease

## 2019-06-16 ENCOUNTER — Telehealth: Payer: Self-pay | Admitting: Pulmonary Disease

## 2019-06-16 MED ORDER — PROAIR HFA 108 (90 BASE) MCG/ACT IN AERS: 2.0000 | INHALATION_SPRAY | RESPIRATORY_TRACT | 0 refills | Status: DC | PRN

## 2019-06-16 NOTE — Telephone Encounter (Signed)
rx for proair sent to pharm and I spoke with the pt and notified that this was done  Nothing further needed

## 2019-06-16 NOTE — Telephone Encounter (Signed)
I called and spoke with patient and made her aware that she's only on night time oxygen and that she would have to have an office visit to qualify for daytime oxygen. She requested that an appointment be scheduled. I have made her an appointment with Dr. Patsey Berthold for 4/21.

## 2019-06-17 ENCOUNTER — Other Ambulatory Visit: Payer: Self-pay | Admitting: Pulmonary Disease

## 2019-06-23 ENCOUNTER — Ambulatory Visit (INDEPENDENT_AMBULATORY_CARE_PROVIDER_SITE_OTHER): Payer: 59 | Admitting: Pulmonary Disease

## 2019-06-23 ENCOUNTER — Encounter: Payer: Self-pay | Admitting: Pulmonary Disease

## 2019-06-23 ENCOUNTER — Other Ambulatory Visit: Payer: Self-pay

## 2019-06-23 VITALS — BP 110/70 | HR 72 | Temp 97.3°F | Ht <= 58 in | Wt 152.0 lb

## 2019-06-23 DIAGNOSIS — G4734 Idiopathic sleep related nonobstructive alveolar hypoventilation: Secondary | ICD-10-CM | POA: Diagnosis not present

## 2019-06-23 DIAGNOSIS — F17210 Nicotine dependence, cigarettes, uncomplicated: Secondary | ICD-10-CM | POA: Diagnosis not present

## 2019-06-23 DIAGNOSIS — J441 Chronic obstructive pulmonary disease with (acute) exacerbation: Secondary | ICD-10-CM | POA: Diagnosis not present

## 2019-06-23 MED ORDER — BUDESONIDE 0.5 MG/2ML IN SUSP: 0.5000 mg | Freq: Two times a day (BID) | RESPIRATORY_TRACT | 3 refills | Status: AC

## 2019-06-23 MED ORDER — PREDNISONE 20 MG PO TABS: 20.0000 mg | ORAL_TABLET | Freq: Every day | ORAL | 0 refills | Status: AC

## 2019-06-23 NOTE — Patient Instructions (Signed)
We are going to give you a prednisone taper.  We will start Pulmicort (budesonide) 0.5 mg via nebulizer twice a day.  Use it after your DuoNeb morning and evening.  Rinse your mouth well after use.  Quit smoking.  Follow-up in 6 to 8 weeks.

## 2019-06-23 NOTE — Progress Notes (Signed)
Subjective:    Patient ID: Shelby Wang, female    DOB: 11/16/1965, 54 y.o.   MRN: [id number]  HPI  This is a 54 year old current smoker (half PPD, 40-pack-year history) last seen here on 28 April 2019 visit she was switched to DuoNeb 4 times a day and inhalers limited due to her inability to breath-hold.  PFT performed 11 March 2019 she has severe COPD with FEV1 of 1.01 L or 47% predicted and FVC of 1.95 L or 73% predicted for an FEV1/FVC of 52%.  She has hyperinflation and air trapping noted on PFTs as well as severe diffusion impairment.  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  She presents today with increased dyspnea over baseline.  No fevers, chills or sweats.  Symptoms started approximately 3 to 4 days ago.  She has increased sputum production which is slightly yellow.  No hemoptysis.  Notices increased wheezing.  Not using nebulizers regularly.  Dressed the importance of using them 4 times a day.  No chest pain.  No other complaint.   DATA : 03/11/2019 PFTs: FEV1 1.01 L or 47% predicted, FVC 1.95 L or 73% predicted, FEV1/FVC 52%.  No bronchodilator response.  TLC 121%, RV 179% consistent with hyperinflation and air trapping.  DLCO severely reduced at 36%.  Review of Systems A 10 point review of systems was performed and it is as noted above otherwise negative.  Patient Active Problem List   Diagnosis Date Noted  . Nocturnal hypoxemia 02/12/2019  . Abnormal findings on diagnostic imaging of lung 02/12/2019  . Medication management 02/12/2019  . Chronic low back pain (Location of Secondary source of pain) (Bilateral) (L>R) 10/12/2015  . Chronic lower extremity pain (Location of Tertiary source of pain) (Left) 10/12/2015  . Chronic hand pain (Bilateral) 10/12/2015  . Chronic breast pain (Location of Primary Source of Pain) (Bilateral) (L>R) 10/12/2015  . Cancer-related pain (left breast) 10/12/2015  . Chronic upper back pain 10/12/2015  . Chronic lumbar  radicular pain (Left) (S1 dermatome) 10/12/2015  . Chronic pain 10/11/2015  . Long term current use of opiate analgesic 10/11/2015  . Long term prescription opiate use 10/11/2015  . Opiate use (37.5 MME/Day) 10/11/2015  . Encounter for therapeutic drug level monitoring 10/11/2015  . Encounter for pain management planning 10/11/2015  . BRCA positive 11/09/2013  . Family history of breast cancer 09/21/2013  . Stress reaction 05/13/2011  . Airway hyperreactivity 02/11/2011  . Personal history of other diseases of the digestive system 02/11/2011  . Current tobacco use 02/11/2011  . 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 COPD (chronic obstructive pulmonary disease) ([place]) 12/06/2010  . Mood disorder ([place]) 12/06/2010  . Inguinal pain, lower left quadrant 10/11/2010  . History of cervical cancer 09/26/2010  . Urethral pain 09/25/2010  . Nausea & vomiting 09/25/2010  . Weight loss 09/25/2010   Social History   Tobacco Use  . Smoking status: Current Every Day Smoker    Packs/day: 0.25    Years: 40.00    Pack years: 10.00    Types: Cigarettes    Start date: 03/04/1978  . Smokeless tobacco: Never Used  . Tobacco comment: 8 cigs a day  Substance Use Topics  . Alcohol use: No    Alcohol/week: 0.0 standard drinks   Allergies  Allergen Reactions  . Adhesive [Tape] Other (See Comments)    Tears skin  . Codeine Hives  . Nsaids Other (See Comments)    Upset stomach  . Sulfa Antibiotics Hives  .  Amoxicillin Rash  . Penicillins Rash   Current Meds  Medication Sig  . buprenorphine-naloxone (SUBOXONE) 8-2 mg SUBL SL tablet Place 1 tablet under the tongue 3 (three) times daily.  . clonazePAM (KLONOPIN) 0.5 MG tablet Take by mouth.  Marland Kitchen LYRICA 150 MG capsule TAKE 1 CAPSULE BY MOUTH TWICE A DAY FOR PAIN  . sertraline (ZOLOFT) 100 MG tablet Take 100 mg by mouth daily.  . traZODone (DESYREL) 100 MG tablet Take 100 mg by mouth at bedtime.  Marland Kitchen  ipratropium-albuterol (DUONEB) 0.5-2.5 (3) MG/3ML SOLN  Take 3 mLs by nebulization every 6 (six) hours as needed.  Marland Kitchen PROAIR HFA 108 (90 Base) MCG/ACT inhaler Inhale 2 puffs into the lungs every 4 (four) hours as needed for wheezing or shortness of breath.       Objective:   Physical Exam BP 110/70 (BP Location: Left Arm, Cuff Size: Normal)   Pulse 72   Temp (!) 97.3 F (36.3 C) (Temporal)   Ht _0  (1.448 m)   Wt 152 lb (68.9 kg)   SpO2 91%   BMI 32.89 kg/m  GENERAL: Disheveled appearing woman, no acute distress presents in transport chair.  Mild tachypnea but no conversational dyspnea. HEAD: Normocephalic, atraumatic.  EYES: Pupils equal, round, reactive to light.  No scleral icterus.  MOUTH: Nose/mouth/throat not examined due to masking requirements for COVID 19. NECK: Supple. No thyromegaly. Trachea midline. No JVD.  No adenopathy. PULMONARY: Good air entry bilaterally.  Diffuse rhonchi throughout, end expiratory wheezes noted.   CARDIOVASCULAR: S1 and S2. Regular rate and rhythm.  No rubs, murmurs or gallops heard. ABDOMEN: Benign. MUSCULOSKELETAL: No joint deformity, no clubbing, no edema.  NEUROLOGIC: Limited neurologic no focal deficits SKIN: Intact,warm,dry. PSYCH: Mood and behavior normal.    Assessment & Plan:     ICD-10-CM   1. COPD with acute exacerbation ([place])  J44.1    Does not appear to be infectious etiology Prednisone taper STOP SMOKING We will add budesonide to nebs  2. Nocturnal hypoxemia  G47.34    Continue supplemental O2  3. Tobacco dependence due to cigarettes  F17.210    Patient counseled regards to discontinuation of smoking Total counseling time 3 to 5 minutes    Meds ordered this encounter  Medications  . predniSONE (DELTASONE) 20 MG tablet    Sig: Take 1 tablet (20 mg total) by mouth daily for 6 days.    Dispense:  6 tablet    Refill:  0  . budesonide (PULMICORT) 0.5 MG/2ML nebulizer solution    Sig: Take 2 mLs (0.5 mg total) by nebulization 2 (two) times daily.    Dispense:  360 mL     Refill:  3   Patient has an active exacerbation.  We are giving her a prednisone taper.  We will start Pulmicort 0.5 mg via nebulizer twice a day.  She will use this after DuoNeb morning and evening.  She has been counseled EXTENSIVELY about discontinuation of smoking.  Follow-up will be in 6 to 8 weeks times she is to contact us prior to that time should any new difficulties arise.  Renold Don, MD [facility]   *This note was dictated using voice recognition software/Dragon.  Despite best efforts to proofread, errors can occur which can change the meaning.  Any change was purely unintentional.

## 2019-08-05 ENCOUNTER — Encounter: Payer: Self-pay | Admitting: Pulmonary Disease

## 2019-08-05 ENCOUNTER — Other Ambulatory Visit: Payer: Self-pay

## 2019-08-05 ENCOUNTER — Ambulatory Visit: Payer: 59 | Admitting: Pulmonary Disease

## 2019-08-05 VITALS — BP 110/90 | HR 69 | Temp 97.3°F | Ht <= 58 in | Wt 154.6 lb

## 2019-08-05 DIAGNOSIS — J449 Chronic obstructive pulmonary disease, unspecified: Secondary | ICD-10-CM

## 2019-08-05 DIAGNOSIS — J9611 Chronic respiratory failure with hypoxia: Secondary | ICD-10-CM

## 2019-08-05 DIAGNOSIS — F17210 Nicotine dependence, cigarettes, uncomplicated: Secondary | ICD-10-CM

## 2019-08-05 MED ORDER — AZITHROMYCIN 250 MG PO TABS: ORAL_TABLET | ORAL | 0 refills | Status: AC

## 2019-08-05 MED ORDER — PREDNISONE 10 MG (21) PO TBPK: ORAL_TABLET | ORAL | 0 refills | Status: DC

## 2019-08-05 NOTE — Progress Notes (Signed)
 Assessment & Plan:  1. COPD with chronic bronchitis and emphysema (HCC) (Primary) Comments: Patient on maximal therapy Poorly compensated Trial of prednisone  taper and azithromycin  as anti-inflammatory Counseled to discontinue tobacco - Ambulatory Referral for DME  2. Chronic respiratory failure with hypoxia (HCC) Comments: No nocturnal hypoxemia due to COPD Desaturates with ambulation Oxygen  at 2 L/min 24/7 - Ambulatory Referral for DME  3. Tobacco dependence due to cigarettes Comments: Patient counseled with regards to discontinuation of smoking Total counseling time 3 to 5 minutes   Patient Instructions  You qualified for supplemental oxygen  today.  We have sent some prescriptions to your pharmacy for prednisone  and a Z-Pak  We will see you in follow-up in 3 to 4 weeks time with either me or the nurse practitioner.  We continue to recommend that you quit smoking.      Please note: late entry documentation due to logistical difficulties during COVID-19 pandemic. This note is filed for information purposes only, and is not intended to be used for billing, nor does it represent the full scope/nature of the visit in question. Please see any associated scanned media linked to date of encounter for additional pertinent information.  Subjective:    HPI: Shelby Wang is a 54 y.o. female presenting to the pulmonology clinic on 08/05/2019 with report of: Follow-up (Patient is here to see if she qualifies for portable oxygen . She sleeps with 2 liters oxygen  already. Patient states that she has shortness of breath all the time and its getting harder to breath. Patient has productive cough with dark yellow sputum.)     Outpatient Encounter Medications as of 08/05/2019  Medication Sig Note   budesonide  (PULMICORT ) 0.5 MG/2ML nebulizer solution Take 2 mLs (0.5 mg total) by nebulization 2 (two) times daily.    buprenorphine-naloxone (SUBOXONE) 8-2 mg SUBL SL tablet Place 1  tablet under the tongue 3 (three) times daily.    clonazePAM (KLONOPIN) 0.5 MG tablet Take by mouth.    LYRICA 150 MG capsule TAKE 1 CAPSULE BY MOUTH TWICE A DAY FOR PAIN 10/11/2015: Received from: External Pharmacy   sertraline (ZOLOFT) 100 MG tablet Take 100 mg by mouth daily.    traZODone (DESYREL) 100 MG tablet Take 100 mg by mouth at bedtime.    [DISCONTINUED] ipratropium-albuterol  (DUONEB) 0.5-2.5 (3) MG/3ML SOLN Take 3 mLs by nebulization every 6 (six) hours as needed.    [DISCONTINUED] PROAIR  HFA 108 (90 Base) MCG/ACT inhaler Inhale 2 puffs into the lungs every 4 (four) hours as needed for wheezing or shortness of breath.    albuterol  (PROVENTIL ) (2.5 MG/3ML) 0.083% nebulizer solution 2.5 mg. 10/11/2015: Received from: [facility] Received Sig: Inhale 3 mL (2.5 mg total) by nebulization every six (6) hours as needed for wheezing.   [EXPIRED] azithromycin  (ZITHROMAX  Z-PAK) 250 MG tablet Take 2 tablets (500 mg) on  Day 1,  followed by 1 tablet (250 mg) once daily on Days 2 through 5.    [DISCONTINUED] predniSONE  (STERAPRED UNI-PAK 21 TAB) 10 MG (21) TBPK tablet Take as directed in the package.  This is a taper package.    No facility-administered encounter medications on file as of 08/05/2019.      Objective:   Vitals:   08/05/19 1440  BP: 110/90  Pulse: 69  Temp: (!) 97.3 F (36.3 C)  Height: 4' 9 (1.448 m)  Weight: 154 lb 9.6 oz (70.1 kg)  SpO2: 97%  TempSrc: Temporal  BMI (Calculated): 33.45     Physical  exam documentation is limited by delayed entry of information.   Ambulatory oximetry was performed today: Patient desaturated to 88% with less than 50 feet of ambulation, placed on 2 L/min nasal cannula O2 and recovered and was able to maintain oxygen  saturations over 90%.

## 2019-08-05 NOTE — Patient Instructions (Addendum)
You qualified for supplemental oxygen today.  We have sent some prescriptions to your pharmacy for prednisone and a Z-Pak  We will see you in follow-up in 3 to 4 weeks time with either me or the nurse practitioner.  We continue to recommend that you quit smoking.

## 2019-08-20 ENCOUNTER — Other Ambulatory Visit: Payer: Self-pay | Admitting: Pulmonary Disease

## 2019-08-26 ENCOUNTER — Telehealth: Payer: Self-pay | Admitting: Pulmonary Disease

## 2019-08-26 NOTE — Telephone Encounter (Addendum)
Called and spoke to pt and relayed below message.  Pt voiced her understanding and had no further questions.  Nothing further is needed.

## 2019-08-26 NOTE — Telephone Encounter (Signed)
She just recently had a prednisone taper and azithromycin.  If she is not responding to this she needs to be seen in the emergency room she may need admission for management of COPD exacerbation.

## 2019-08-26 NOTE — Telephone Encounter (Signed)
Called and spoke to pt, who reports of chest tightness,prod cough with green mucus, headache, wheezing and voice hoariness due to cough x3d Pt using albuterol neb 6-7x daily with no improvement.  Pt is requesting zpak or prednisone, as this has helped previously.   Dr. Patsey Berthold, please advise. Thanks.

## 2019-09-02 ENCOUNTER — Telehealth: Payer: Self-pay | Admitting: Pulmonary Disease

## 2019-09-02 NOTE — Telephone Encounter (Signed)
Pt is aware that we have received Rx from [facility].  Advised pt that I will contact her once Rx has been completed and faxed back.

## 2019-09-02 NOTE — Telephone Encounter (Signed)
Written Rx request for [facility] along with last office note has been faxed back to [facility]. Pt is aware and voiced her understanding.  Nothing further is needed.

## 2019-09-07 ENCOUNTER — Telehealth: Payer: Self-pay | Admitting: Pulmonary Disease

## 2019-09-07 NOTE — Telephone Encounter (Signed)
Spoke to pt's Daughter, Sunni (DPR).  Sunni stated that pt is out of proair and insurance will not pay for it until 09/11/2019.  I have recommended that pt obtain a coupon on goodrx, as she will need to pay out of pocket until 09/11/2019. Sunni voiced her understanding and had no further questions.  Nothing further is needed.

## 2019-09-21 ENCOUNTER — Other Ambulatory Visit: Payer: Self-pay

## 2019-09-21 ENCOUNTER — Encounter: Payer: Self-pay | Admitting: Pulmonary Disease

## 2019-09-21 ENCOUNTER — Ambulatory Visit: Payer: 59 | Admitting: Pulmonary Disease

## 2019-09-21 VITALS — BP 116/78 | HR 81 | Temp 97.8°F | Ht <= 58 in | Wt 162.0 lb

## 2019-09-21 DIAGNOSIS — F17210 Nicotine dependence, cigarettes, uncomplicated: Secondary | ICD-10-CM

## 2019-09-21 DIAGNOSIS — J9611 Chronic respiratory failure with hypoxia: Secondary | ICD-10-CM

## 2019-09-21 DIAGNOSIS — J449 Chronic obstructive pulmonary disease, unspecified: Secondary | ICD-10-CM

## 2019-09-21 DIAGNOSIS — K219 Gastro-esophageal reflux disease without esophagitis: Secondary | ICD-10-CM

## 2019-09-21 MED ORDER — OMEPRAZOLE 40 MG PO CPDR: 40.0000 mg | DELAYED_RELEASE_CAPSULE | Freq: Every day | ORAL | 6 refills | Status: AC

## 2019-09-21 NOTE — Progress Notes (Signed)
    Assessment & Plan:  1. COPD with chronic bronchitis and emphysema (HCC) (Primary)  2. Chronic respiratory failure with hypoxia (HCC)  3. Gastroesophageal reflux disease, unspecified whether esophagitis present  4. Tobacco dependence due to cigarettes   Patient Instructions  We will try you on medication for acid reflux  Continue your nebulizers as prescribed  If you continue to have issues with chest pain you need to notify your primary care provider  We will see you in follow-up in 2 months time  Please note: late entry documentation due to logistical difficulties during COVID-19 pandemic. This note is filed for information purposes only, and is not intended to be used for billing, nor does it represent the full scope/nature of the visit in question. Please see any associated scanned media linked to date of encounter for additional pertinent information.  Subjective:    HPI: Shelby Wang is a 54 y.o. female presenting to the pulmonology clinic on 09/21/2019 with report of: Follow-up (pt states breathing has worsen since last OV. c/o sob with exertion, wheezing, prod cough grey in color and chest tightness. )     Outpatient Encounter Medications as of 09/21/2019  Medication Sig Note   albuterol  (PROVENTIL ) (2.5 MG/3ML) 0.083% nebulizer solution 2.5 mg. 10/11/2015: Received from: [facility] Received Sig: Inhale 3 mL (2.5 mg total) by nebulization every six (6) hours as needed for wheezing.   budesonide  (PULMICORT ) 0.5 MG/2ML nebulizer solution Take 2 mLs (0.5 mg total) by nebulization 2 (two) times daily.    buprenorphine-naloxone (SUBOXONE) 8-2 mg SUBL SL tablet Place 1 tablet under the tongue 3 (three) times daily.    clonazePAM (KLONOPIN) 0.5 MG tablet Take by mouth.    LYRICA 150 MG capsule TAKE 1 CAPSULE BY MOUTH TWICE A DAY FOR PAIN 10/11/2015: Received from: External Pharmacy   PROAIR  HFA 108 (90 Base) MCG/ACT inhaler INHALE 2 PUFFS INTO THE LUNGS EVERY 4 (FOUR)  HOURS AS NEEDED FOR WHEEZING OR SHORTNESS OF BREATH.    sertraline (ZOLOFT) 100 MG tablet Take 100 mg by mouth daily.    traZODone (DESYREL) 100 MG tablet Take 100 mg by mouth at bedtime.    [DISCONTINUED] ipratropium-albuterol  (DUONEB) 0.5-2.5 (3) MG/3ML SOLN Take 3 mLs by nebulization every 6 (six) hours as needed.    [DISCONTINUED] predniSONE  (STERAPRED UNI-PAK 21 TAB) 10 MG (21) TBPK tablet Take as directed in the package.  This is a taper package.    omeprazole  (PRILOSEC) 40 MG capsule Take 1 capsule (40 mg total) by mouth daily.    No facility-administered encounter medications on file as of 09/21/2019.      Objective:   Vitals:   09/21/19 1527  BP: 116/78  Pulse: 81  Temp: 97.8 F (36.6 C)  Height: 4' 9 (1.448 m)  Weight: 162 lb (73.5 kg)  SpO2: 99%  TempSrc: Temporal  BMI (Calculated): 35.05     Physical exam documentation is limited by delayed entry of information.

## 2019-09-21 NOTE — Patient Instructions (Signed)
We will try you on medication for acid reflux  Continue your nebulizers as prescribed  If you continue to have issues with chest pain you need to notify your primary care provider  We will see you in follow-up in 2 months time

## 2019-11-23 ENCOUNTER — Ambulatory Visit: Payer: 59 | Admitting: Pulmonary Disease

## 2020-01-09 ENCOUNTER — Other Ambulatory Visit: Payer: Self-pay | Admitting: Pulmonary Disease

## 2022-02-12 DIAGNOSIS — Z79899 Other long term (current) drug therapy: Secondary | ICD-10-CM | POA: Diagnosis not present

## 2022-02-12 DIAGNOSIS — J449 Chronic obstructive pulmonary disease, unspecified: Secondary | ICD-10-CM | POA: Diagnosis not present

## 2022-02-12 DIAGNOSIS — M546 Pain in thoracic spine: Secondary | ICD-10-CM | POA: Diagnosis not present

## 2022-02-12 DIAGNOSIS — M545 Low back pain, unspecified: Secondary | ICD-10-CM | POA: Diagnosis not present

## 2022-02-19 DIAGNOSIS — Z79899 Other long term (current) drug therapy: Secondary | ICD-10-CM | POA: Diagnosis not present

## 2022-07-16 DIAGNOSIS — M48061 Spinal stenosis, lumbar region without neurogenic claudication: Secondary | ICD-10-CM | POA: Diagnosis not present

## 2022-07-16 DIAGNOSIS — M25571 Pain in right ankle and joints of right foot: Secondary | ICD-10-CM | POA: Diagnosis not present

## 2022-07-16 DIAGNOSIS — E7800 Pure hypercholesterolemia, unspecified: Secondary | ICD-10-CM | POA: Diagnosis not present

## 2022-07-16 DIAGNOSIS — M79671 Pain in right foot: Secondary | ICD-10-CM | POA: Diagnosis not present

## 2022-07-16 DIAGNOSIS — M545 Low back pain, unspecified: Secondary | ICD-10-CM | POA: Diagnosis not present

## 2022-07-16 DIAGNOSIS — Z79899 Other long term (current) drug therapy: Secondary | ICD-10-CM | POA: Diagnosis not present

## 2022-07-16 DIAGNOSIS — S96911A Strain of unspecified muscle and tendon at ankle and foot level, right foot, initial encounter: Secondary | ICD-10-CM | POA: Diagnosis not present

## 2022-07-16 DIAGNOSIS — G8929 Other chronic pain: Secondary | ICD-10-CM | POA: Diagnosis not present

## 2022-07-16 DIAGNOSIS — J449 Chronic obstructive pulmonary disease, unspecified: Secondary | ICD-10-CM | POA: Diagnosis not present

## 2022-07-18 DIAGNOSIS — J449 Chronic obstructive pulmonary disease, unspecified: Secondary | ICD-10-CM | POA: Diagnosis not present

## 2022-08-02 DIAGNOSIS — F329 Major depressive disorder, single episode, unspecified: Secondary | ICD-10-CM | POA: Diagnosis not present

## 2022-08-02 DIAGNOSIS — F439 Reaction to severe stress, unspecified: Secondary | ICD-10-CM | POA: Diagnosis not present

## 2022-08-02 DIAGNOSIS — F419 Anxiety disorder, unspecified: Secondary | ICD-10-CM | POA: Diagnosis not present

## 2022-08-06 ENCOUNTER — Encounter (HOSPITAL_COMMUNITY): Payer: Self-pay

## 2022-08-06 ENCOUNTER — Other Ambulatory Visit: Payer: Self-pay

## 2022-08-06 ENCOUNTER — Emergency Department (HOSPITAL_COMMUNITY)
Admission: EM | Admit: 2022-08-06 | Discharge: 2022-08-06 | Discharge status: Against Medical Advice | Payer: 59 | Attending: Emergency Medicine | Admitting: Emergency Medicine

## 2022-08-06 DIAGNOSIS — Z5321 Procedure and treatment not carried out due to patient leaving prior to being seen by health care provider: Secondary | ICD-10-CM | POA: Diagnosis not present

## 2022-08-06 DIAGNOSIS — J449 Chronic obstructive pulmonary disease, unspecified: Secondary | ICD-10-CM | POA: Insufficient documentation

## 2022-08-06 DIAGNOSIS — R404 Transient alteration of awareness: Secondary | ICD-10-CM | POA: Diagnosis not present

## 2022-08-06 DIAGNOSIS — Z743 Need for continuous supervision: Secondary | ICD-10-CM | POA: Diagnosis not present

## 2022-08-06 DIAGNOSIS — W19XXXA Unspecified fall, initial encounter: Secondary | ICD-10-CM | POA: Diagnosis not present

## 2022-08-06 DIAGNOSIS — R55 Syncope and collapse: Secondary | ICD-10-CM | POA: Diagnosis not present
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
## 2022-08-06 NOTE — ED Provider Notes (Signed)
21: 35 reports she does not want to be seen.  She is ambulatory with her cane and her dog.  She states that she has to go because her son will lied to her and her car is in [employer].  She reports she has to go get her car and cannot or will not stay for further evaluation.  She reports that she will be all right but she will try giving her daughter a call and if not is going to try to find some transportation to get to her car.  I highly encouraged the patient to stay and advise I do not think she should try to walk to wherever her destination is, at this time, she refuses treatment advises that she really must take care of these other things.  Mental status is clear.  She is not situationally confused.  At this time she has capacity for decision-making and I do not believe she can appropriately be restrained for treatment.   Arby Barrette, MD 08/06/22 2142

## 2022-08-18 DIAGNOSIS — J449 Chronic obstructive pulmonary disease, unspecified: Secondary | ICD-10-CM | POA: Diagnosis not present

## 2022-09-04 DIAGNOSIS — Z87891 Personal history of nicotine dependence: Secondary | ICD-10-CM | POA: Diagnosis not present

## 2022-09-04 DIAGNOSIS — J449 Chronic obstructive pulmonary disease, unspecified: Secondary | ICD-10-CM | POA: Diagnosis not present

## 2022-09-06 DIAGNOSIS — G8929 Other chronic pain: Secondary | ICD-10-CM | POA: Diagnosis not present

## 2022-09-06 DIAGNOSIS — M545 Low back pain, unspecified: Secondary | ICD-10-CM | POA: Diagnosis not present

## 2022-09-06 DIAGNOSIS — S96911A Strain of unspecified muscle and tendon at ankle and foot level, right foot, initial encounter: Secondary | ICD-10-CM | POA: Diagnosis not present

## 2022-09-06 DIAGNOSIS — Z79899 Other long term (current) drug therapy: Secondary | ICD-10-CM | POA: Diagnosis not present

## 2022-09-06 DIAGNOSIS — M48061 Spinal stenosis, lumbar region without neurogenic claudication: Secondary | ICD-10-CM | POA: Diagnosis not present

## 2022-09-06 DIAGNOSIS — Z6823 Body mass index (BMI) 23.0-23.9, adult: Secondary | ICD-10-CM | POA: Diagnosis not present

## 2022-09-12 DIAGNOSIS — Z79899 Other long term (current) drug therapy: Secondary | ICD-10-CM | POA: Diagnosis not present

## 2022-09-14 DIAGNOSIS — R634 Abnormal weight loss: Secondary | ICD-10-CM | POA: Diagnosis not present

## 2022-09-14 DIAGNOSIS — E7800 Pure hypercholesterolemia, unspecified: Secondary | ICD-10-CM | POA: Diagnosis not present

## 2022-09-14 DIAGNOSIS — E559 Vitamin D deficiency, unspecified: Secondary | ICD-10-CM | POA: Diagnosis not present

## 2022-09-14 DIAGNOSIS — N1831 Chronic kidney disease, stage 3a: Secondary | ICD-10-CM | POA: Diagnosis not present

## 2022-09-14 DIAGNOSIS — R112 Nausea with vomiting, unspecified: Secondary | ICD-10-CM | POA: Diagnosis not present

## 2022-09-14 DIAGNOSIS — K219 Gastro-esophageal reflux disease without esophagitis: Secondary | ICD-10-CM | POA: Diagnosis not present

## 2022-09-14 DIAGNOSIS — Z79899 Other long term (current) drug therapy: Secondary | ICD-10-CM | POA: Diagnosis not present

## 2022-09-14 DIAGNOSIS — J449 Chronic obstructive pulmonary disease, unspecified: Secondary | ICD-10-CM | POA: Diagnosis not present

## 2022-09-17 DIAGNOSIS — J449 Chronic obstructive pulmonary disease, unspecified: Secondary | ICD-10-CM | POA: Diagnosis not present

## 2022-09-18 DIAGNOSIS — J449 Chronic obstructive pulmonary disease, unspecified: Secondary | ICD-10-CM | POA: Diagnosis not present

## 2022-09-18 DIAGNOSIS — S30860A Insect bite (nonvenomous) of lower back and pelvis, initial encounter: Secondary | ICD-10-CM | POA: Diagnosis not present

## 2022-09-18 DIAGNOSIS — W57XXXA Bitten or stung by nonvenomous insect and other nonvenomous arthropods, initial encounter: Secondary | ICD-10-CM | POA: Diagnosis not present

## 2022-09-18 DIAGNOSIS — F17210 Nicotine dependence, cigarettes, uncomplicated: Secondary | ICD-10-CM | POA: Diagnosis not present

## 2022-09-18 DIAGNOSIS — J439 Emphysema, unspecified: Secondary | ICD-10-CM | POA: Diagnosis not present

## 2023-11-24 NOTE — Progress Notes (Signed)
 This encounter was created in error - please disregard.
